# Patient Record
Sex: Male | Born: 1965 | Race: White | Hispanic: No | Marital: Married | Smoking: Former smoker
Health system: Southern US, Community
[De-identification: ages and names within clinical notes are randomized; demographics above are authoritative.]

## PROBLEM LIST (undated history)

## (undated) DIAGNOSIS — I219 Acute myocardial infarction, unspecified: Secondary | ICD-10-CM

## (undated) DIAGNOSIS — Z720 Tobacco use: Secondary | ICD-10-CM

## (undated) DIAGNOSIS — I358 Other nonrheumatic aortic valve disorders: Secondary | ICD-10-CM

## (undated) DIAGNOSIS — R091 Pleurisy: Secondary | ICD-10-CM

## (undated) DIAGNOSIS — Z9889 Other specified postprocedural states: Secondary | ICD-10-CM

## (undated) DIAGNOSIS — D4989 Neoplasm of unspecified behavior of other specified sites: Secondary | ICD-10-CM

## (undated) HISTORY — DX: Neoplasm of unspecified behavior of other specified sites: D49.89

## (undated) HISTORY — DX: Other nonrheumatic aortic valve disorders: I35.8

---

## 2012-10-14 ENCOUNTER — Inpatient Hospital Stay (HOSPITAL_COMMUNITY)
Admission: EM | Admit: 2012-10-14 | Discharge: 2012-10-23 | DRG: 220 | Disposition: A | Discharge status: Home / Self Care | Payer: MEDICAID | Attending: Thoracic Surgery (Cardiothoracic Vascular Surgery) | Admitting: Thoracic Surgery (Cardiothoracic Vascular Surgery)

## 2012-10-14 ENCOUNTER — Emergency Department (HOSPITAL_COMMUNITY): Payer: Self-pay

## 2012-10-14 ENCOUNTER — Encounter (HOSPITAL_COMMUNITY): Payer: Self-pay

## 2012-10-14 DIAGNOSIS — I359 Nonrheumatic aortic valve disorder, unspecified: Secondary | ICD-10-CM | POA: Diagnosis present

## 2012-10-14 DIAGNOSIS — E781 Pure hyperglyceridemia: Secondary | ICD-10-CM | POA: Diagnosis present

## 2012-10-14 DIAGNOSIS — E876 Hypokalemia: Secondary | ICD-10-CM | POA: Diagnosis not present

## 2012-10-14 DIAGNOSIS — Z720 Tobacco use: Secondary | ICD-10-CM | POA: Diagnosis present

## 2012-10-14 DIAGNOSIS — R079 Chest pain, unspecified: Secondary | ICD-10-CM

## 2012-10-14 DIAGNOSIS — J90 Pleural effusion, not elsewhere classified: Secondary | ICD-10-CM | POA: Diagnosis present

## 2012-10-14 DIAGNOSIS — Z9889 Other specified postprocedural states: Secondary | ICD-10-CM

## 2012-10-14 DIAGNOSIS — I214 Non-ST elevation (NSTEMI) myocardial infarction: Principal | ICD-10-CM | POA: Diagnosis present

## 2012-10-14 DIAGNOSIS — F17200 Nicotine dependence, unspecified, uncomplicated: Secondary | ICD-10-CM | POA: Diagnosis present

## 2012-10-14 DIAGNOSIS — Z880 Allergy status to penicillin: Secondary | ICD-10-CM

## 2012-10-14 DIAGNOSIS — I219 Acute myocardial infarction, unspecified: Secondary | ICD-10-CM | POA: Diagnosis present

## 2012-10-14 DIAGNOSIS — D72829 Elevated white blood cell count, unspecified: Secondary | ICD-10-CM | POA: Diagnosis not present

## 2012-10-14 DIAGNOSIS — D62 Acute posthemorrhagic anemia: Secondary | ICD-10-CM | POA: Diagnosis not present

## 2012-10-14 HISTORY — DX: Pleurisy: R09.1

## 2012-10-14 HISTORY — DX: Other specified postprocedural states: Z98.890

## 2012-10-14 HISTORY — DX: Tobacco use: Z72.0

## 2012-10-14 HISTORY — DX: Acute myocardial infarction, unspecified: I21.9

## 2012-10-14 LAB — CBC WITH DIFFERENTIAL/PLATELET
Basophils Relative: 1 % (ref 0–1)
Eosinophils Absolute: 0.4 10*3/uL (ref 0.0–0.7)
Hemoglobin: 16.6 g/dL (ref 13.0–17.0)
MCH: 31.3 pg (ref 26.0–34.0)
MCHC: 35.9 g/dL (ref 30.0–36.0)
Monocytes Relative: 7 % (ref 3–12)
Neutrophils Relative %: 67 % (ref 43–77)
Platelets: 247 10*3/uL (ref 150–400)

## 2012-10-14 LAB — BASIC METABOLIC PANEL
BUN: 8 mg/dL (ref 6–23)
Calcium: 10.1 mg/dL (ref 8.4–10.5)
GFR calc Af Amer: >90 mL/min (ref >90)
GFR calc non Af Amer: >90 mL/min (ref >90)
Potassium: 3.4 mEq/L — ABNORMAL LOW (ref 3.5–5.1)

## 2012-10-14 MED ORDER — NITROGLYCERIN 0.4 MG SL SUBL
0.4000 mg | SUBLINGUAL_TABLET | SUBLINGUAL | Status: DC | PRN
  Administered 2012-10-14 (×2): 0.4 mg via SUBLINGUAL

## 2012-10-14 MED ORDER — HEPARIN (PORCINE) IN NACL 100-0.45 UNIT/ML-% IJ SOLN
15.0000 [IU]/kg/h | INTRAMUSCULAR | Status: DC
  Filled 2012-10-14: qty 250

## 2012-10-14 MED ORDER — NITROGLYCERIN IN D5W 200-5 MCG/ML-% IV SOLN: 5.0000 ug/min | Freq: Once | INTRAVENOUS | Status: DC

## 2012-10-14 MED ORDER — NITROGLYCERIN IN D5W 200-5 MCG/ML-% IV SOLN
5.0000 ug/min | Freq: Once | INTRAVENOUS | Status: AC
  Administered 2012-10-14: 5 ug/min via INTRAVENOUS
  Filled 2012-10-14: qty 250

## 2012-10-14 MED ORDER — NITROGLYCERIN 0.4 MG SL SUBL
0.4000 mg | SUBLINGUAL_TABLET | Freq: Once | SUBLINGUAL | Status: AC
  Administered 2012-10-14: 0.4 mg via SUBLINGUAL
  Filled 2012-10-14: qty 25

## 2012-10-14 MED ORDER — ONDANSETRON HCL 4 MG/2ML IJ SOLN: 4.0000 mg | Freq: Three times a day (TID) | INTRAMUSCULAR | Status: AC | PRN

## 2012-10-14 MED ORDER — HEPARIN (PORCINE) IN NACL 100-0.45 UNIT/ML-% IJ SOLN
1650.0000 [IU]/h | INTRAMUSCULAR | Status: DC
  Administered 2012-10-14: 900 [IU]/h via INTRAVENOUS
  Administered 2012-10-15 – 2012-10-16 (×3): 1600 [IU]/h via INTRAVENOUS
  Administered 2012-10-18 (×2): 1650 [IU]/h via INTRAVENOUS
  Filled 2012-10-14 (×8): qty 250

## 2012-10-14 MED ORDER — SODIUM CHLORIDE 0.9 % IV SOLN: Freq: Once | INTRAVENOUS | Status: AC

## 2012-10-14 MED ORDER — HEPARIN SODIUM (PORCINE) 5000 UNIT/ML IJ SOLN
4500.0000 [IU] | Freq: Once | INTRAMUSCULAR | Status: DC
  Filled 2012-10-14: qty 1

## 2012-10-14 MED ORDER — ASPIRIN 81 MG PO CHEW
324.0000 mg | CHEWABLE_TABLET | Freq: Once | ORAL | Status: AC
  Administered 2012-10-14: 324 mg via ORAL
  Filled 2012-10-14: qty 4

## 2012-10-14 MED ORDER — SODIUM CHLORIDE 0.9 % IV SOLN
INTRAVENOUS | Status: AC
  Administered 2012-10-14: 125 mL/h via INTRAVENOUS

## 2012-10-14 MED ORDER — SODIUM CHLORIDE 0.9 % IV SOLN
Freq: Once | INTRAVENOUS | Status: AC
  Administered 2012-10-14: 500 mL via INTRAVENOUS

## 2012-10-14 MED ORDER — NITROGLYCERIN IN D5W 200-5 MCG/ML-% IV SOLN
5.0000 ug/min | INTRAVENOUS | Status: DC
  Administered 2012-10-14: 15 ug/min via INTRAVENOUS
  Filled 2012-10-14: qty 250

## 2012-10-14 MED ORDER — HEPARIN BOLUS VIA INFUSION
4000.0000 [IU] | Freq: Once | INTRAVENOUS | Status: AC
  Administered 2012-10-14: 4000 [IU] via INTRAVENOUS

## 2012-10-14 MED ORDER — HYDROMORPHONE HCL PF 1 MG/ML IJ SOLN: 0.5000 mg | INTRAMUSCULAR | Status: AC | PRN

## 2012-10-14 NOTE — ED Notes (Signed)
CRITICAL VALUE ALERT  Critical value received:  Troponin I 1.13  Date of notification:  10/14/12  Time of notification:  0747  Critical value read back:yes  Nurse who received alert:  Lawernce Ion  MD notified (1st page):  Bonk  Time of first page:  1747  MD notified (2nd page):  Time of second page:  Responding MD:  Rulon Abide  Time MD responded:  [phone]

## 2012-10-14 NOTE — ED Notes (Addendum)
BP 153/81 1 st nitro given pain 5/10 prior to administration. Pt stated pain had already started to ease up before nitro given.

## 2012-10-14 NOTE — Progress Notes (Signed)
ANTICOAGULATION CONSULT NOTE - Initial Consult  Pharmacy Consult for Heparin Indication: chest pain/ACS  Allergies  Allergen Reactions  . Penicillins     Nausea and headache    Patient Measurements: Height: 5\' 9"  (175.3 cm) Weight: 160 lb (72.576 kg) IBW/kg (Calculated) : 70.7  Heparin Dosing Weight: 72.6 kg  Vital Signs: Temp: 97.7 F (36.5 C) (11/30 1720) Temp src: Oral (11/30 1720) BP: 120/81 mmHg (11/30 1821) Pulse Rate: 84  (11/30 1821)  Labs:  [facility] 10/14/12 1656  HGB 16.6  HCT 46.3  PLT 247  APTT --  LABPROT --  INR --  HEPARINUNFRC --  CREATININE 0.93  CKTOTAL --  CKMB --  TROPONINI 1.13*    Estimated Creatinine Clearance: 98.2 ml/min (by C-G formula based on Cr of 0.93).   Medical History: Past Medical History  Diagnosis Date  . Pleurisy     Medications:  Scheduled:    . [COMPLETED] aspirin  324 mg Oral Once  . [COMPLETED] nitroGLYCERIN  0.4 mg Sublingual Once  . nitroGLYCERIN  5 mcg/min Intravenous Once  . [DISCONTINUED] heparin  4,500 Units Intravenous Once     Assessment: Ok for protocol  Goal of Therapy:  Heparin level 0.3-0.7 units/ml Monitor platelets by anticoagulation protocol: Yes   Plan:  Give 4000 units bolus x 1 Start heparin infusion at 900 units/hr Check anti-Xa level in 6 hours and daily while on heparin Monitor platelets per protocol Labs per protocol  Raquel James, Christien Berthelot Bennett 10/14/2012,6:39 PM

## 2012-10-14 NOTE — Progress Notes (Signed)
[facility] paged and notified of pt status upon admission. Pt VSS, pain rated as 0 on a 0 to 10 scale, will continue to monitor.

## 2012-10-14 NOTE — ED Notes (Signed)
Pt stated pain 1/10, 3rd nitro given , BP 129/81

## 2012-10-14 NOTE — ED Notes (Signed)
Pt reports approx 1 hour ago was out raking leaves and felt like an elephant on his chest and aching and numbness in left arm.  Reports was diaphoretic.  Denies any n/v or SOB

## 2012-10-14 NOTE — ED Notes (Signed)
2nd nitro given, pain 2/10, BP 146/91

## 2012-10-14 NOTE — ED Provider Notes (Signed)
History   This chart was scribed for Jones Skene, MD, by Frederik Pear, ER scribe. The patient was seen in room APA08/APA08 and the patient's care was started at 1701.   CSN: [id number]  Arrival date & time 10/14/12  1649   First MD Initiated Contact with Patient 10/14/12 1701      Chief Complaint  Patient presents with  . Chest Pain    (Consider location/radiation/quality/duration/timing/severity/associated sxs/prior treatment) HPI Comments: Andrew Le is a 46 y.o. male with a h/o of pleurisy who presents to the Emergency Department complaining of constant, moderate chest pain that began one hour PTA while raking leaves.  He states that the 7/10 pain felt heavy in the center of his chest. He states that he was diaphorietic and had nausea when the pain began, but now just has chest pain. He also reports associated left arm pain with numbness and tingling. He denies any associated abdominal or neck pain, leg swelling, coughing, indigestion, acid reflux, fever, chills, or emesis. No dizziness. His wife states that he current does not see a PCP regularly, but has no known h/o of heart conditions. He states that his mother had an aneurysm which started in his kidneys and ascended to the heart in 1986 and his father had a h/o of heart conditions.                Past Medical History  Diagnosis Date  . Pleurisy     History reviewed. No pertinent past surgical history.  No family history on file.  History  Substance Use Topics  . Smoking status: Current Every Day Smoker  . Smokeless tobacco: Not on file  . Alcohol Use: Yes     Comment: occ      Review of Systems At least 10pt or greater review of systems completed and are negative except where specified in the HPI. Allergies  Penicillins  Home Medications  No current outpatient prescriptions on file.  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    ED Course  Procedures (including critical care time)  Date: 10/14/2012  Time 1657  Rate: 82  Rhythm: normal sinus rhythm  QRS Axis: normal  Intervals: normal  ST/T Wave abnormalities: normal  Conduction Disutrbances: none  Narrative Interpretation: unremarkable - patient does have some J-point elevation in leads 2, 3, aVF, V3 - nonischemic EKG   Date: 10/14/2012 Time 1750  Rate: 78   Rhythm: normal sinus rhythm  QRS Axis: normal  Intervals: normal  ST/T Wave abnormalities: normal  Conduction Disutrbances: none  Narrative Interpretation: unremarkable - patient does have some J-point elevation in leads 2, 3, aVF, V3 - nonischemic EKG   DIAGNOSTIC STUDIES: Oxygen Saturation is 99% on room air, normal by my interpretation.    COORDINATION OF CARE:  17:22- Discussed planned course of treatment with the patient, including a chest X-ray, blood work, and an EKG, who is agreeable at this time.   17:30- Medication Orders- aspirin chewable tablet 324 mg- Once, nitroglycerin (NITROSTATE) SL tablet 0.4 mg- Once.  Results for orders placed during the hospital encounter of 10/14/12  CBC WITH DIFFERENTIAL      Component Value Range   WBC 12.1 (*) 4.0 - 10.5 K/uL   RBC 5.31  4.22 - 5.81 MIL/uL   Hemoglobin 16.6  13.0 - 17.0 g/dL  HCT 46.3  39.0 - 52.0 %   MCV 87.2  78.0 - 100.0 fL   MCH 31.3  26.0 - 34.0 pg   MCHC 35.9  30.0 - 36.0 g/dL   RDW 19.1  47.8 - 29.5 %   Platelets 247  150 - 400 K/uL   Neutrophils Relative 67  43 - 77 %   Neutro Abs 8.2 (*) 1.7 - 7.7 K/uL   Lymphocytes Relative 22  12 - 46 %   Lymphs Abs 2.6  0.7 - 4.0 K/uL   Monocytes Relative 7  3 - 12 %   Monocytes Absolute 0.9  0.1 - 1.0 K/uL   Eosinophils Relative 3  0 - 5 %   Eosinophils Absolute 0.4  0.0 - 0.7 K/uL   Basophils Relative 1  0 - 1 %   Basophils Absolute 0.1  0.0 - 0.1 K/uL  BASIC METABOLIC PANEL      Component Value Range   Sodium 136   135 - 145 mEq/L   Potassium 3.4 (*) 3.5 - 5.1 mEq/L   Chloride 99  96 - 112 mEq/L   CO2 26  19 - 32 mEq/L   Glucose, Bld 98  70 - 99 mg/dL   BUN 8  6 - 23 mg/dL   Creatinine, Ser 6.21  0.50 - 1.35 mg/dL   Calcium 30.8  8.4 - 65.7 mg/dL   GFR calc non Af Amer >90  >90 mL/min   GFR calc Af Amer >90  >90 mL/min  TROPONIN I      Component Value Range   Troponin I 1.13 (*) <0.30 ng/mL     Labs Reviewed  CBC WITH DIFFERENTIAL  BASIC METABOLIC PANEL  TROPONIN I   Dg Chest Portable 1 View  10/14/2012  *RADIOLOGY REPORT*  Clinical Data: Chest pain radiating into the left upper arm. Diaphoresis.  PORTABLE CHEST - 1 VIEW 10/14/2012 1706 hours:  Comparison: None.  Findings: Cardiac silhouette normal and mediastinal contours unremarkable for the AP portable technique.  Lungs clear. Pulmonary vascularity normal.  Bronchovascular markings normal.  No pneumothorax.  No pleural effusions.  IMPRESSION: No acute cardiopulmonary disease.  Normal heart size.   Original Report Authenticated By: Hulan Saas, M.D.      1. Chest pain   2. Elevated troponin       MDM  Andrew Le is a 46 y.o. male presenting with a history of her concerning for acute coronary syndrome. Initial EKG shows no changes consistent with ST elevation MI.  He says pain does get better with nitroglycerin sublingual, drops to a 2/10. Labs show an elevated troponin at 1.13.  She is pain quickly returns after nitroglycerin he is put on nitroglycerin drip. Patient is heparinized. Discussed with patient - patient will need to be admitted likely for further testing possible cardiac catheterization. Based on the patient's chest x-ray do not think the patient has an aortic dissection. Chest x-ray is nonacute.   10/14/2012 6:30 PM D/W Cardiology Fellow for Admission to [facility] ICU for CP. Bed request entered.   I personally performed the services described in this documentation, which was scribed in my presence. The recorded  information has been reviewed and is accurate. Jones Skene, M.D.          Jones Skene, MD 10/14/12 2112

## 2012-10-15 DIAGNOSIS — R079 Chest pain, unspecified: Secondary | ICD-10-CM

## 2012-10-15 DIAGNOSIS — I219 Acute myocardial infarction, unspecified: Secondary | ICD-10-CM | POA: Diagnosis present

## 2012-10-15 DIAGNOSIS — I214 Non-ST elevation (NSTEMI) myocardial infarction: Secondary | ICD-10-CM

## 2012-10-15 HISTORY — DX: Acute myocardial infarction, unspecified: I21.9

## 2012-10-15 LAB — LIPID PANEL
Cholesterol: 235 mg/dL — ABNORMAL HIGH (ref 0–200)
HDL: 23 mg/dL — ABNORMAL LOW (ref >39)
LDL Cholesterol: UNDETERMINED mg/dL (ref 0–99)
Total CHOL/HDL Ratio: 10.2 RATIO
Triglycerides: 650 mg/dL — ABNORMAL HIGH (ref <150)
VLDL: UNDETERMINED mg/dL (ref 0–40)

## 2012-10-15 LAB — BASIC METABOLIC PANEL
BUN: 8 mg/dL (ref 6–23)
CO2: 24 mEq/L (ref 19–32)
Calcium: 8.8 mg/dL (ref 8.4–10.5)
Chloride: 107 mEq/L (ref 96–112)
Creatinine, Ser: 0.86 mg/dL (ref 0.50–1.35)
GFR calc Af Amer: >90 mL/min (ref >90)
GFR calc non Af Amer: >90 mL/min (ref >90)
Glucose, Bld: 122 mg/dL — ABNORMAL HIGH (ref 70–99)
Potassium: 3.6 mEq/L (ref 3.5–5.1)
Sodium: 140 mEq/L (ref 135–145)

## 2012-10-15 LAB — HEMOGLOBIN A1C: Hgb A1c MFr Bld: 5.5 % (ref <5.7)

## 2012-10-15 LAB — CBC
MCV: 86.7 fL (ref 78.0–100.0)
Platelets: 226 10*3/uL (ref 150–400)
RDW: 12.8 % (ref 11.5–15.5)
WBC: 13.7 10*3/uL — ABNORMAL HIGH (ref 4.0–10.5)

## 2012-10-15 LAB — TROPONIN I
Troponin I: 6.83 ng/mL (ref <0.30)
Troponin I: 12.8 ng/mL (ref <0.30)
Troponin I: 7.3 ng/mL (ref <0.30)

## 2012-10-15 LAB — TSH: TSH: 6.301 u[IU]/mL — ABNORMAL HIGH (ref 0.350–4.500)

## 2012-10-15 LAB — HEPARIN LEVEL (UNFRACTIONATED)
Heparin Unfractionated: 0.13 IU/mL — ABNORMAL LOW (ref 0.30–0.70)
Heparin Unfractionated: 0.47 IU/mL (ref 0.30–0.70)

## 2012-10-15 MED ORDER — SODIUM CHLORIDE 0.9 % IV SOLN
INTRAVENOUS | Status: DC | PRN
  Administered 2012-10-15 – 2012-10-17 (×2): via INTRAVENOUS

## 2012-10-15 MED ORDER — ONDANSETRON HCL 4 MG/2ML IJ SOLN: 4.0000 mg | Freq: Four times a day (QID) | INTRAMUSCULAR | Status: DC | PRN

## 2012-10-15 MED ORDER — ATORVASTATIN CALCIUM 40 MG PO TABS
40.0000 mg | ORAL_TABLET | Freq: Every day | ORAL | Status: DC
  Administered 2012-10-15 – 2012-10-22 (×8): 40 mg via ORAL
  Filled 2012-10-15 (×10): qty 1

## 2012-10-15 MED ORDER — POTASSIUM CHLORIDE CRYS ER 20 MEQ PO TBCR
20.0000 meq | EXTENDED_RELEASE_TABLET | Freq: Two times a day (BID) | ORAL | Status: DC
  Administered 2012-10-15 – 2012-10-18 (×9): 20 meq via ORAL
  Filled 2012-10-15 (×12): qty 1

## 2012-10-15 MED ORDER — NITROGLYCERIN 0.4 MG SL SUBL: 0.4000 mg | SUBLINGUAL_TABLET | SUBLINGUAL | Status: DC | PRN

## 2012-10-15 MED ORDER — HEPARIN BOLUS VIA INFUSION
2000.0000 [IU] | Freq: Once | INTRAVENOUS | Status: AC
  Administered 2012-10-15: 2000 [IU] via INTRAVENOUS
  Filled 2012-10-15: qty 2000

## 2012-10-15 MED ORDER — ASPIRIN EC 81 MG PO TBEC
81.0000 mg | DELAYED_RELEASE_TABLET | Freq: Every day | ORAL | Status: DC
  Administered 2012-10-15 – 2012-10-18 (×4): 81 mg via ORAL
  Filled 2012-10-15 (×5): qty 1

## 2012-10-15 MED ORDER — ATORVASTATIN CALCIUM 40 MG PO TABS
40.0000 mg | ORAL_TABLET | Freq: Every day | ORAL | Status: DC
  Filled 2012-10-15: qty 1

## 2012-10-15 MED ORDER — METOPROLOL TARTRATE 12.5 MG HALF TABLET
12.5000 mg | ORAL_TABLET | Freq: Two times a day (BID) | ORAL | Status: DC
  Administered 2012-10-15 – 2012-10-16 (×5): 12.5 mg via ORAL
  Filled 2012-10-15 (×7): qty 1

## 2012-10-15 MED ORDER — ACETAMINOPHEN 325 MG PO TABS
650.0000 mg | ORAL_TABLET | ORAL | Status: DC | PRN
  Administered 2012-10-15: 650 mg via ORAL

## 2012-10-15 NOTE — Progress Notes (Signed)
  Echocardiogram 2D Echocardiogram has been performed.  Andrew Le 10/15/2012, 10:30 AM

## 2012-10-15 NOTE — Progress Notes (Addendum)
ANTICOAGULATION CONSULT NOTE  Pharmacy Consult for Heparin Indication: chest pain/ACS  Allergies  Allergen Reactions  . Penicillins     Nausea and headache    Patient Measurements: Height: 5\' 9"  (175.3 cm) Weight: 160 lb (72.576 kg) IBW/kg (Calculated) : 70.7  Heparin Dosing Weight: 72.6 kg  Vital Signs: Temp: 98 F (36.7 C) (12/01 0000) Temp src: Oral (12/01 0000) BP: 135/75 mmHg (12/01 0100) Pulse Rate: 68  (12/01 0100)  Labs:  [facility] 10/15/12 0050 10/14/12 1656  HGB 14.1 16.6  HCT 39.7 46.3  PLT 226 247  APTT -- --  LABPROT -- --  INR -- --  HEPARINUNFRC 0.13* --  CREATININE -- 0.93  CKTOTAL -- --  CKMB -- --  TROPONINI -- 1.13*    Estimated Creatinine Clearance: 98.2 ml/min (by C-G formula based on Cr of 0.93).  Assessment: 46 yo male with chest pain for heparin Goal of Therapy:  Heparin level 0.3-0.7 units/ml Monitor platelets by anticoagulation protocol: Yes   Plan:  Heparin 2000 units IV bolus, then increase heparin 1200 units/hr Check heparin level in 6 hours.  Eddie Candle 10/15/2012,2:01 AM

## 2012-10-15 NOTE — Progress Notes (Deleted)
MD notified of pt's present afib. Will continue to monitor. VSS 

## 2012-10-15 NOTE — H&P (Signed)
Andrew Le is an 46 y.o. male.    Chief Complaint: Chest Pain  HPI: 46 y/o male with no significant PMH presenting for chest pain evaluation.  He was in his usual state of health until the day prior to admission when he developed chest pain while walking up the stairs.  Chest pain is described as pressure, 7/10 in severity, associated with shortness of breath, diaphoresis, nausea but no vomiting.  He presented to [facility] ER where his ECG showed sinus rhythm (84 bpm) and no ST- or T-wave changes to suggest ischemia. His first set of cardiac marker showed a troponin-I of 1.13.  He started on Heparin drip, Nitro drip and Aspirin and transferred to [facility] for further evaluation. Currently, he is chest pain free, and he is clinically stable.  Past Medical History  Diagnosis Date  . Pleurisy     History reviewed. No pertinent past surgical history.  No family history on file. Social History:  reports that he has been smoking.  He does not have any smokeless tobacco history on file. He reports that he drinks alcohol. He reports that he does not use illicit drugs.  Allergies:  Allergies  Allergen Reactions  . Penicillins     Nausea and headache   Medication at home: None  No prescriptions prior to admission    Results for orders placed during the hospital encounter of 10/14/12 (from the past 48 hour(s))  CBC WITH DIFFERENTIAL     Status: Abnormal   Collection Time   10/14/12  4:56 PM      Component Value Range Comment   WBC 12.1 (*) 4.0 - 10.5 K/uL    RBC 5.31  4.22 - 5.81 MIL/uL    Hemoglobin 16.6  13.0 - 17.0 g/dL    HCT 16.1  09.6 - 04.5 %    MCV 87.2  78.0 - 100.0 fL    MCH 31.3  26.0 - 34.0 pg    MCHC 35.9  30.0 - 36.0 g/dL    RDW 40.9  81.1 - 91.4 %    Platelets 247  150 - 400 K/uL    Neutrophils Relative 67  43 - 77 %    Neutro Abs 8.2 (*) 1.7 - 7.7 K/uL    Lymphocytes Relative 22  12 - 46 %    Lymphs Abs 2.6  0.7 - 4.0 K/uL    Monocytes Relative 7  3 - 12 %    Monocytes Absolute 0.9  0.1 - 1.0 K/uL    Eosinophils Relative 3  0 - 5 %    Eosinophils Absolute 0.4  0.0 - 0.7 K/uL    Basophils Relative 1  0 - 1 %    Basophils Absolute 0.1  0.0 - 0.1 K/uL   BASIC METABOLIC PANEL     Status: Abnormal   Collection Time   10/14/12  4:56 PM      Component Value Range Comment   Sodium 136  135 - 145 mEq/L    Potassium 3.4 (*) 3.5 - 5.1 mEq/L    Chloride 99  96 - 112 mEq/L    CO2 26  19 - 32 mEq/L    Glucose, Bld 98  70 - 99 mg/dL    BUN 8  6 - 23 mg/dL    Creatinine, Ser 7.82  0.50 - 1.35 mg/dL    Calcium 95.6  8.4 - 10.5 mg/dL    GFR calc non Af Amer >90  >90 mL/min  GFR calc Af Amer >90  >90 mL/min   TROPONIN I     Status: Abnormal   Collection Time   10/14/12  4:56 PM      Component Value Range Comment   Troponin I 1.13 (*) <0.30 ng/mL   MRSA PCR SCREENING     Status: Normal   Collection Time   10/14/12 10:02 PM      Component Value Range Comment   MRSA by PCR NEGATIVE  NEGATIVE   HEPARIN LEVEL (UNFRACTIONATED)     Status: Abnormal   Collection Time   10/15/12 12:50 AM      Component Value Range Comment   Heparin Unfractionated 0.13 (*) 0.30 - 0.70 IU/mL   CBC     Status: Abnormal   Collection Time   10/15/12 12:50 AM      Component Value Range Comment   WBC 13.7 (*) 4.0 - 10.5 K/uL    RBC 4.58  4.22 - 5.81 MIL/uL    Hemoglobin 14.1  13.0 - 17.0 g/dL    HCT 16.1  09.6 - 04.5 %    MCV 86.7  78.0 - 100.0 fL    MCH 30.8  26.0 - 34.0 pg    MCHC 35.5  30.0 - 36.0 g/dL    RDW 40.9  81.1 - 91.4 %    Platelets 226  150 - 400 K/uL    Dg Chest Portable 1 View  10/14/2012  *RADIOLOGY REPORT*  Clinical Data: Chest pain radiating into the left upper arm. Diaphoresis.  PORTABLE CHEST - 1 VIEW 10/14/2012 1706 hours:  Comparison: None.  Findings: Cardiac silhouette normal and mediastinal contours unremarkable for the AP portable technique.  Lungs clear. Pulmonary vascularity normal.  Bronchovascular markings normal.  No pneumothorax.  No pleural  effusions.  IMPRESSION: No acute cardiopulmonary disease.  Normal heart size.   Original Report Authenticated By: Hulan Saas, M.D.     Review of Systems  Constitutional: Negative for fever, chills, weight loss, malaise/fatigue and diaphoresis.  HENT: Negative for hearing loss, ear pain, nosebleeds, congestion, sore throat, neck pain, tinnitus and ear discharge.   Eyes: Negative for blurred vision, double vision, photophobia, pain, discharge and redness.  Respiratory: Positive for shortness of breath. Negative for cough, hemoptysis, sputum production, wheezing and stridor.   Cardiovascular: Positive for chest pain. Negative for palpitations, orthopnea, claudication, leg swelling and PND.  Gastrointestinal: Positive for nausea. Negative for heartburn, vomiting, abdominal pain, diarrhea, constipation, blood in stool and melena.  Genitourinary: Negative for dysuria, urgency, frequency, hematuria and flank pain.  Musculoskeletal: Negative for myalgias, back pain, joint pain and falls.  Skin: Negative for itching and rash.  Neurological: Negative for dizziness, tingling, tremors, sensory change, weakness and headaches.  Psychiatric/Behavioral: Negative for depression, suicidal ideas and hallucinations.    Blood pressure 138/71, pulse 78, temperature 98 F (36.7 C), temperature source Oral, resp. rate 18, height 5\' 9"  (1.753 m), weight 72.576 kg (160 lb), SpO2 98.00%. Physical Exam  Constitutional: He is oriented to person, place, and time. He appears well-developed and well-nourished. No distress.  HENT:  Head: Normocephalic and atraumatic.  Eyes: EOM are normal. Right eye exhibits no discharge. Left eye exhibits no discharge. No scleral icterus.  Neck: Neck supple. No JVD present. No tracheal deviation present.  Cardiovascular: Normal rate, regular rhythm and normal heart sounds.  Exam reveals no friction rub.   No murmur heard. Respiratory: No stridor. No respiratory distress. He has no  wheezes. He has no rales. He exhibits no  tenderness.  GI: He exhibits no distension. There is no tenderness. There is no rebound.  Musculoskeletal: He exhibits no edema and no tenderness.  Neurological: He is alert and oriented to person, place, and time.  Skin: No rash noted. He is not diaphoretic. No erythema.  Psychiatric: He has a normal mood and affect.     Assessment/Plan  1. NSTEMI 2. Hypokalemia  I will admit the patient to cardiology service and observe him on telemetry.  I will continue the Heparin drip, wean the Nitro drip and start Asprin 81 mg qd, Lipitor 40 mg qhs and low dose beta-blockers.  I will obtain a transthoracic echocardiogram in the morning to evaluate his left ventricular function, and keep him NPO on Sunday night for a cardiac catheterization on Monday or sooner if he becomes unstable or with has chest pain that we are not able to adequately control. I will replace his potassium with K-dur.  Neziah Braley E 10/15/2012, 2:16 AM

## 2012-10-15 NOTE — Progress Notes (Signed)
ANTICOAGULATION CONSULT NOTE  Pharmacy Consult for Heparin Indication: chest pain/ACS  Allergies  Allergen Reactions  . Penicillins     Nausea and headache    Patient Measurements: Height: 5\' 9"  (175.3 cm) Weight: 165 lb 2 oz (74.9 kg) IBW/kg (Calculated) : 70.7   Vital Signs: Temp: 99 F (37.2 C) (12/01 1200) Temp src: Oral (12/01 1552) BP: 111/66 mmHg (12/01 1500) Pulse Rate: 74  (12/01 1500)  Labs:  Basename 10/15/12 1534 10/15/12 0903 10/15/12 0251 10/15/12 0250 10/15/12 0050 10/14/12 1656  HGB -- -- -- -- 14.1 16.6  HCT -- -- -- -- 39.7 46.3  PLT -- -- -- -- 226 247  APTT -- -- -- -- -- --  LABPROT -- -- -- -- -- --  INR -- -- -- -- -- --  HEPARINUNFRC 0.47 0.15* -- -- 0.13* --  CREATININE -- -- 0.86 -- -- 0.93  CKTOTAL -- -- -- -- -- --  CKMB -- -- -- -- -- --  TROPONINI -- 12.80* -- 6.83* -- 1.13*    Estimated Creatinine Clearance: 106.2 ml/min (by C-G formula based on Cr of 0.86).     . heparin 1,600 Units/hr (10/15/12 1120)  . nitroGLYCERIN 15 mcg/min (10/15/12 0047)  . [DISCONTINUED] heparin       Assessment: 46 yo male with chest pain on anticoagulation with Heparin.  Rate was increased due to a subtherapeutic level.  His repeat level was drawn ~2 hours early and is within the therapeutic range.  It is possible that residual bolus effect could be contributing to his current level.  Goal of Therapy:  Heparin level 0.3-0.7 units/ml Monitor platelets by anticoagulation protocol: Yes   Plan:  Continue Heparin at 1600 units/hr Recheck heparin level in 6 hours to confirm   Estella Husk, Pharm.D., BCPS Clinical Pharmacist  Phone [phone] Pager [phone] 10/15/2012, 4:23 PM

## 2012-10-15 NOTE — Progress Notes (Signed)
ANTICOAGULATION CONSULT NOTE  Pharmacy Consult for Heparin Indication: chest pain/ACS  Allergies  Allergen Reactions  . Penicillins     Nausea and headache    Patient Measurements: Height: 5\' 9"  (175.3 cm) Weight: 165 lb 2 oz (74.9 kg) IBW/kg (Calculated) : 70.7  Heparin Dosing Weight: 72.6 kg  Vital Signs: Temp: 97.5 F (36.4 C) (12/01 0800) Temp src: Oral (12/01 0800) BP: 128/63 mmHg (12/01 1000) Pulse Rate: 70  (12/01 1000)  Labs:  Basename 10/15/12 0903 10/15/12 0251 10/15/12 0250 10/15/12 0050 10/14/12 1656  HGB -- -- -- 14.1 16.6  HCT -- -- -- 39.7 46.3  PLT -- -- -- 226 247  APTT -- -- -- -- --  LABPROT -- -- -- -- --  INR -- -- -- -- --  HEPARINUNFRC 0.15* -- -- 0.13* --  CREATININE -- 0.86 -- -- 0.93  CKTOTAL -- -- -- -- --  CKMB -- -- -- -- --  TROPONINI 12.80* -- 6.83* -- 1.13*    Estimated Creatinine Clearance: 106.2 ml/min (by C-G formula based on Cr of 0.86).  Assessment: 46 yo male with chest pain for heparin Goal of Therapy:  Heparin level 0.3-0.7 units/ml Monitor platelets by anticoagulation protocol: Yes   Plan:  Heparin 2000 units IV bolus, then increase heparin 1600units/hr Check heparin level in 6 hours.  Mickeal Skinner 10/15/2012,10:52 AM

## 2012-10-15 NOTE — Progress Notes (Signed)
CRITICAL VALUE ALERT  Critical value received:  Troponin   Date of notification: 10/15/2012   Time of notification:  0405  Critical value read back:yes  Nurse who received alert:  Sherry Ruffing, RN  MD notified (1st page):  Skeet Latch, MD  Time of first page:  0406  MD notified (2nd page):  Time of second page:  Responding MD:    Time MD responded:  [phone]

## 2012-10-15 NOTE — Progress Notes (Signed)
ANTICOAGULATION CONSULT NOTE  Pharmacy Consult for Heparin Indication: chest pain/ACS  Allergies  Allergen Reactions  . Penicillins     Nausea and headache    Patient Measurements: Height: 5\' 9"  (175.3 cm) Weight: 165 lb 2 oz (74.9 kg) IBW/kg (Calculated) : 70.7   Vital Signs: Temp: 98.7 F (37.1 C) (12/01 2000) Temp src: Oral (12/01 2000) BP: 107/60 mmHg (12/01 2300) Pulse Rate: 65  (12/01 2300)  Labs:  Basename 10/15/12 2210 10/15/12 1534 10/15/12 0903 10/15/12 0251 10/15/12 0250 10/15/12 0050 10/14/12 1656  HGB -- -- -- -- -- 14.1 16.6  HCT -- -- -- -- -- 39.7 46.3  PLT -- -- -- -- -- 226 247  APTT -- -- -- -- -- -- --  LABPROT -- -- -- -- -- -- --  INR -- -- -- -- -- -- --  HEPARINUNFRC 0.43 0.47 0.15* -- -- -- --  CREATININE -- -- -- 0.86 -- -- 0.93  CKTOTAL -- -- -- -- -- -- --  CKMB -- -- -- -- -- -- --  TROPONINI -- 7.30* 12.80* -- 6.83* -- --    Estimated Creatinine Clearance: 106.2 ml/min (by C-G formula based on Cr of 0.86).     . heparin 1,600 Units/hr (10/15/12 1120)  . nitroGLYCERIN 15 mcg/min (10/15/12 0047)     Assessment: 46 yo male with chest pain on anticoagulation with Heparin. Heparin level (0.43) is at-goal on 1600 units/hr.   Goal of Therapy:  Heparin level 0.3-0.7 units/ml Monitor platelets by anticoagulation protocol: Yes   Plan:  1. Continue IV heparin at 1600 units/hr 2. Daily CBC, heparin level  Lorre Munroe, PharmD 10/15/2012, 11:42 PM

## 2012-10-15 NOTE — Progress Notes (Signed)
SUBJECTIVE:  Patient admitted early this am for chest pain.   Currently no chest pain.    PHYSICAL EXAM Filed Vitals:   10/15/12 0500 10/15/12 0600 10/15/12 0700 10/15/12 0800  BP: 130/79 124/77 127/73 124/77  Pulse: 64 63 64 79  Temp:    97.5 F (36.4 C)  TempSrc:    Oral  Resp: 19 11 18 20   Height: 5\' 9"  (1.753 m)     Weight: 165 lb 2 oz (74.9 kg)     SpO2: 98% 97% 98% 98%   General:  No distress Lungs:  Clear Heart:  RRR Abdomen:  Positive bowel sounds, no rebound no guarding Extremities:  No edema  LABS: Lab Results  Component Value Date   TROPONINI 6.83* 10/15/2012   Results for orders placed during the hospital encounter of 10/14/12 (from the past 24 hour(s))  CBC WITH DIFFERENTIAL     Status: Abnormal   Collection Time   10/14/12  4:56 PM      Component Value Range   WBC 12.1 (*) 4.0 - 10.5 K/uL   RBC 5.31  4.22 - 5.81 MIL/uL   Hemoglobin 16.6  13.0 - 17.0 g/dL   HCT 96.0  45.4 - 09.8 %   MCV 87.2  78.0 - 100.0 fL   MCH 31.3  26.0 - 34.0 pg   MCHC 35.9  30.0 - 36.0 g/dL   RDW 11.9  14.7 - 82.9 %   Platelets 247  150 - 400 K/uL   Neutrophils Relative 67  43 - 77 %   Neutro Abs 8.2 (*) 1.7 - 7.7 K/uL   Lymphocytes Relative 22  12 - 46 %   Lymphs Abs 2.6  0.7 - 4.0 K/uL   Monocytes Relative 7  3 - 12 %   Monocytes Absolute 0.9  0.1 - 1.0 K/uL   Eosinophils Relative 3  0 - 5 %   Eosinophils Absolute 0.4  0.0 - 0.7 K/uL   Basophils Relative 1  0 - 1 %   Basophils Absolute 0.1  0.0 - 0.1 K/uL  BASIC METABOLIC PANEL     Status: Abnormal   Collection Time   10/14/12  4:56 PM      Component Value Range   Sodium 136  135 - 145 mEq/L   Potassium 3.4 (*) 3.5 - 5.1 mEq/L   Chloride 99  96 - 112 mEq/L   CO2 26  19 - 32 mEq/L   Glucose, Bld 98  70 - 99 mg/dL   BUN 8  6 - 23 mg/dL   Creatinine, Ser 5.62  0.50 - 1.35 mg/dL   Calcium 13.0  8.4 - 86.5 mg/dL   GFR calc non Af Amer >90  >90 mL/min   GFR calc Af Amer >90  >90 mL/min  TROPONIN I     Status:  Abnormal   Collection Time   10/14/12  4:56 PM      Component Value Range   Troponin I 1.13 (*) <0.30 ng/mL  MRSA PCR SCREENING     Status: Normal   Collection Time   10/14/12 10:02 PM      Component Value Range   MRSA by PCR NEGATIVE  NEGATIVE  HEPARIN LEVEL (UNFRACTIONATED)     Status: Abnormal   Collection Time   10/15/12 12:50 AM      Component Value Range   Heparin Unfractionated 0.13 (*) 0.30 - 0.70 IU/mL  CBC     Status: Abnormal   Collection Time  10/15/12 12:50 AM      Component Value Range   WBC 13.7 (*) 4.0 - 10.5 K/uL   RBC 4.58  4.22 - 5.81 MIL/uL   Hemoglobin 14.1  13.0 - 17.0 g/dL   HCT 16.1  09.6 - 04.5 %   MCV 86.7  78.0 - 100.0 fL   MCH 30.8  26.0 - 34.0 pg   MCHC 35.5  30.0 - 36.0 g/dL   RDW 40.9  81.1 - 91.4 %   Platelets 226  150 - 400 K/uL  TROPONIN I     Status: Abnormal   Collection Time   10/15/12  2:50 AM      Component Value Range   Troponin I 6.83 (*) <0.30 ng/mL  PRO B NATRIURETIC PEPTIDE     Status: Normal   Collection Time   10/15/12  2:50 AM      Component Value Range   Pro B Natriuretic peptide (BNP) 55.8  0 - 125 pg/mL  BASIC METABOLIC PANEL     Status: Abnormal   Collection Time   10/15/12  2:51 AM      Component Value Range   Sodium 140  135 - 145 mEq/L   Potassium 3.6  3.5 - 5.1 mEq/L   Chloride 107  96 - 112 mEq/L   CO2 24  19 - 32 mEq/L   Glucose, Bld 122 (*) 70 - 99 mg/dL   BUN 8  6 - 23 mg/dL   Creatinine, Ser 7.82  0.50 - 1.35 mg/dL   Calcium 8.8  8.4 - 95.6 mg/dL   GFR calc non Af Amer >90  >90 mL/min   GFR calc Af Amer >90  >90 mL/min  LIPID PANEL     Status: Abnormal   Collection Time   10/15/12  4:45 AM      Component Value Range   Cholesterol 235 (*) 0 - 200 mg/dL   Triglycerides 213 (*) <150 mg/dL   HDL 23 (*) >08 mg/dL   Total CHOL/HDL Ratio 10.2     VLDL UNABLE TO CALCULATE IF TRIGLYCERIDE OVER 400 mg/dL  0 - 40 mg/dL   LDL Cholesterol UNABLE TO CALCULATE IF TRIGLYCERIDE OVER 400 mg/dL  0 - 99 mg/dL     Intake/Output Summary (Last 24 hours) at 10/15/12 0848 Last data filed at 10/15/12 0700  Gross per 24 hour  Intake 597.82 ml  Output      0 ml  Net 597.82 ml    EKG:  NSR rate 64, no acute ST T wave changes.   10/15/2012   ASSESSMENT AND PLAN:  NQWMI Cath in am.  The patient understands that risks included but are not limited to stroke (1 in 1000), death (1 in 1000), kidney failure [usually temporary] (1 in 500), bleeding (1 in 200), allergic reaction [possibly serious] (1 in 200).  The patient understands and agrees to proceed.   HYPERTRIGLYCERIDEMIA He was started on a statin and I will order a dietary consult.  He might need combination therapy.  TSH and A1C pending     Andrew Le 10/15/2012 8:48 AM

## 2012-10-16 ENCOUNTER — Encounter (HOSPITAL_COMMUNITY)
Admission: EM | Disposition: A | Discharge status: Home / Self Care | Payer: Self-pay | Source: Home / Self Care | Attending: Cardiology

## 2012-10-16 DIAGNOSIS — R7989 Other specified abnormal findings of blood chemistry: Secondary | ICD-10-CM

## 2012-10-16 DIAGNOSIS — I339 Acute and subacute endocarditis, unspecified: Secondary | ICD-10-CM

## 2012-10-16 LAB — HEPARIN LEVEL (UNFRACTIONATED): Heparin Unfractionated: 0.36 IU/mL (ref 0.30–0.70)

## 2012-10-16 LAB — CBC
MCHC: 34.9 g/dL (ref 30.0–36.0)
MCV: 88.3 fL (ref 78.0–100.0)
Platelets: 222 10*3/uL (ref 150–400)
RDW: 12.7 % (ref 11.5–15.5)
WBC: 11.5 10*3/uL — ABNORMAL HIGH (ref 4.0–10.5)

## 2012-10-16 SURGERY — ECHOCARDIOGRAM, TRANSESOPHAGEAL
Anesthesia: Moderate Sedation

## 2012-10-16 MED ORDER — MIDAZOLAM HCL 2 MG/2ML IJ SOLN
INTRAMUSCULAR | Status: AC
  Administered 2012-10-16: 2 mg
  Filled 2012-10-16: qty 2

## 2012-10-16 MED ORDER — MIDAZOLAM HCL 2 MG/2ML IJ SOLN
INTRAMUSCULAR | Status: AC
  Administered 2012-10-16: 2 mg
  Filled 2012-10-16: qty 4

## 2012-10-16 MED ORDER — MIDAZOLAM HCL 2 MG/2ML IJ SOLN
INTRAMUSCULAR | Status: AC
  Administered 2012-10-16: 6 mg
  Filled 2012-10-16: qty 2

## 2012-10-16 MED ORDER — FENTANYL CITRATE 0.05 MG/ML IJ SOLN
INTRAMUSCULAR | Status: AC
  Administered 2012-10-16: 100 ug
  Filled 2012-10-16: qty 2

## 2012-10-16 MED ORDER — ZOLPIDEM TARTRATE 5 MG PO TABS
5.0000 mg | ORAL_TABLET | Freq: Every evening | ORAL | Status: DC | PRN
  Administered 2012-10-16 – 2012-10-18 (×3): 5 mg via ORAL
  Filled 2012-10-16 (×3): qty 1

## 2012-10-16 NOTE — Interval H&P Note (Signed)
History and Physical Interval Note:  10/16/2012 2:25 PM  Andrew Le  has presented today for surgery, with the diagnosis of endocarditis  The various methods of treatment have been discussed with the patient and family. After consideration of risks, benefits and other options for treatment, the patient has consented to  Procedure(s) (LRB) with comments: TRANSESOPHAGEAL ECHOCARDIOGRAM (TEE) (N/A) as a surgical intervention .  The patient's history has been reviewed, patient examined, no change in status, stable for surgery.  I have reviewed the patient's chart and labs.  Questions were answered to the patient's satisfaction.     Theron Arista [facility] 10/16/2012 2:25 PM

## 2012-10-16 NOTE — Progress Notes (Signed)
ANTICOAGULATION CONSULT NOTE - Follow Up Consult  Pharmacy Consult for heparin Indication: chest pain/ACS  Allergies  Allergen Reactions  . Penicillins     Nausea and headache    Patient Measurements: Height: 5\' 9"  (175.3 cm) Weight: 160 lb 7.9 oz (72.8 kg) IBW/kg (Calculated) : 70.7    Vital Signs: Temp: 98.3 F (36.8 C) (12/02 0700) Temp src: Oral (12/02 0700) BP: 117/69 mmHg (12/02 0700) Pulse Rate: 68  (12/02 0700)  Labs:  Basename 10/16/12 0500 10/15/12 2210 10/15/12 1534 10/15/12 0903 10/15/12 0251 10/15/12 0250 10/15/12 0050 10/14/12 1656  HGB 14.5 -- -- -- -- -- 14.1 --  HCT 41.5 -- -- -- -- -- 39.7 46.3  PLT 222 -- -- -- -- -- 226 247  APTT -- -- -- -- -- -- -- --  LABPROT -- -- -- -- -- -- -- --  INR -- -- -- -- -- -- -- --  HEPARINUNFRC 0.36 0.43 0.47 -- -- -- -- --  CREATININE -- -- -- -- 0.86 -- -- 0.93  CKTOTAL -- -- -- -- -- -- -- --  CKMB -- -- -- -- -- -- -- --  TROPONINI -- -- 7.30* 12.80* -- 6.83* -- --    Estimated Creatinine Clearance: 106.2 ml/min (by C-G formula based on Cr of 0.86).  Assessment: Patient is a 46 y.o M on heparin for suspected ACS.  Holding off on cath procedure for now d/t aortic mass and plan for TEE later today.  Cont heparin therapy per cards.  Heparin level is at goal this morning.  Goal of Therapy:  Heparin level 0.3-0.7 units/ml Monitor platelets by anticoagulation protocol: Yes   Plan:  Plan: 1) no change for heparin   Everline Mahaffy P 10/16/2012,9:07 AM

## 2012-10-16 NOTE — CV Procedure (Signed)
TEE preliminary report.  Performed at bedside.  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 The aortic valve is mildly thickened. There is no aortic insufficiency. No annular abscess. The other valves are normal. LV function is normal. Full report to follow.  Peter Swaz[place] MD, [facility]

## 2012-10-16 NOTE — Progress Notes (Signed)
  Echocardiogram 2D Echocardiogram has been performed.  Andrew Le 10/16/2012, 3:32 PM

## 2012-10-16 NOTE — Plan of Care (Signed)
Problem: Food- and Nutrition-Related Knowledge Deficit (NB-1.1) Goal: Nutrition education Formal process to instruct or train a patient/client in a skill or to impart knowledge to help patients/clients voluntarily manage or modify food choices and eating behavior to maintain or improve health.  Outcome: Completed/Met Date Met:  10/16/12 RD consulted for diet education. Pt with elevated Triglycerides and Cholesterol.  Lipid Panel     Component Value Date/Time    CHOL 235* 10/15/2012 0445    TRIG 650* 10/15/2012 0445    HDL 23* 10/15/2012 0445    CHOLHDL 10.2 10/15/2012 0445    VLDL UNABLE TO CALCULATE IF TRIGLYCERIDE OVER 400 mg/dL 21/01/864 7846    LDLCALC UNABLE TO CALCULATE IF TRIGLYCERIDE OVER 400 mg/dL 96/12/9526 4132     RD provided pt with nutrition hand outs from the [employer] for lowering Triglycerides and Cholesterol.  RD went over hand outs including foods to avoid that can increase both values such as saturated and trans fat, and high intake of carbohydrates. RD also went over foods to increase, lean protein, fiber and fruits/vegetables.   Pt verbalized understanding of information. No additional questions at this time. Chart reviewed, no additional nutrition interventions at this time. Please re-consult as needed.   Clarene Duke RD, LDN Pager [phone] After Hours pager [phone]

## 2012-10-16 NOTE — Progress Notes (Signed)
  SUBJECTIVE:  No further chest pain.  He feels well.  No SOB   PHYSICAL EXAM Filed Vitals:   10/16/12 0300 10/16/12 0400 10/16/12 0500 10/16/12 0600  BP: 111/74 118/76 114/67 115/72  Pulse: 67 65 70 70  Temp:  98.3 F (36.8 C)    TempSrc:  Oral    Resp: 17 15 14 15  Height:      Weight:   160 lb 7.9 oz (72.8 kg)   SpO2: 96% 97% 97% 96%   General:  No distress Lungs:  Clear Heart:  RRR Abdomen:  Positive bowel sounds, no rebound no guarding Extremities:  No edema  LABS: Lab Results  Component Value Date   TROPONINI 7.30* 10/15/2012   Results for orders placed during the hospital encounter of 10/14/12 (from the past 24 hour(s))  HEPARIN LEVEL (UNFRACTIONATED)     Status: Abnormal   Collection Time   10/15/12  9:03 AM      Component Value Range   Heparin Unfractionated 0.15 (*) 0.30 - 0.70 IU/mL  TROPONIN I     Status: Abnormal   Collection Time   10/15/12  9:03 AM      Component Value Range   Troponin I 12.80 (*) <0.30 ng/mL  TROPONIN I     Status: Abnormal   Collection Time   10/15/12  3:34 PM      Component Value Range   Troponin I 7.30 (*) <0.30 ng/mL  HEPARIN LEVEL (UNFRACTIONATED)     Status: Normal   Collection Time   10/15/12  3:34 PM      Component Value Range   Heparin Unfractionated 0.47  0.30 - 0.70 IU/mL  HEPARIN LEVEL (UNFRACTIONATED)     Status: Normal   Collection Time   10/15/12 10:10 PM      Component Value Range   Heparin Unfractionated 0.43  0.30 - 0.70 IU/mL  HEPARIN LEVEL (UNFRACTIONATED)     Status: Normal   Collection Time   10/16/12  5:00 AM      Component Value Range   Heparin Unfractionated 0.36  0.30 - 0.70 IU/mL  CBC     Status: Abnormal   Collection Time   10/16/12  5:00 AM      Component Value Range   WBC 11.5 (*) 4.0 - 10.5 K/uL   RBC 4.70  4.22 - 5.81 MIL/uL   Hemoglobin 14.5  13.0 - 17.0 g/dL   HCT 41.5  39.0 - 52.0 %   MCV 88.3  78.0 - 100.0 fL   MCH 30.9  26.0 - 34.0 pg   MCHC 34.9  30.0 - 36.0 g/dL   RDW 12.7  11.5 -  15.5 %   Platelets 222  150 - 400 K/uL    Intake/Output Summary (Last 24 hours) at 10/16/12 0742 Last data filed at 10/16/12 0600  Gross per 24 hour  Intake 1835.17 ml  Output   3535 ml  Net -1699.83 ml    ASSESSMENT AND PLAN:  AORTIC MASS:   There is an aortic mass identified above the valve in or slightly above the left coronary cusp.  I am arranging a TEE and I have discussed this at length with the patient and his wife.  Embolization from this mass could explain the elevated enzymes. These are trending down.   NQWMI Given the aortic mass he will not get a cath.  If we need to we could likely look at his coronaries with CT.  There was no wall   motion abnormality. I will stop the IV NTG.  I will continue the heparin for now.  Also on beta blocker.   HYPERTRIGLYCERIDEMIA Continue statin and dietary consult ordered.      Aaliyan Brinkmeier 10/16/2012 7:42 AM   

## 2012-10-16 NOTE — H&P (View-Only) (Signed)
SUBJECTIVE:  No further chest pain.  He feels well.  No SOB   PHYSICAL EXAM Filed Vitals:   10/16/12 0300 10/16/12 0400 10/16/12 0500 10/16/12 0600  BP: 111/74 118/76 114/67 115/72  Pulse: 67 65 70 70  Temp:  98.3 F (36.8 C)    TempSrc:  Oral    Resp: 17 15 14 15   Height:      Weight:   160 lb 7.9 oz (72.8 kg)   SpO2: 96% 97% 97% 96%   General:  No distress Lungs:  Clear Heart:  RRR Abdomen:  Positive bowel sounds, no rebound no guarding Extremities:  No edema  LABS: Lab Results  Component Value Date   TROPONINI 7.30* 10/15/2012   Results for orders placed during the hospital encounter of 10/14/12 (from the past 24 hour(s))  HEPARIN LEVEL (UNFRACTIONATED)     Status: Abnormal   Collection Time   10/15/12  9:03 AM      Component Value Range   Heparin Unfractionated 0.15 (*) 0.30 - 0.70 IU/mL  TROPONIN I     Status: Abnormal   Collection Time   10/15/12  9:03 AM      Component Value Range   Troponin I 12.80 (*) <0.30 ng/mL  TROPONIN I     Status: Abnormal   Collection Time   10/15/12  3:34 PM      Component Value Range   Troponin I 7.30 (*) <0.30 ng/mL  HEPARIN LEVEL (UNFRACTIONATED)     Status: Normal   Collection Time   10/15/12  3:34 PM      Component Value Range   Heparin Unfractionated 0.47  0.30 - 0.70 IU/mL  HEPARIN LEVEL (UNFRACTIONATED)     Status: Normal   Collection Time   10/15/12 10:10 PM      Component Value Range   Heparin Unfractionated 0.43  0.30 - 0.70 IU/mL  HEPARIN LEVEL (UNFRACTIONATED)     Status: Normal   Collection Time   10/16/12  5:00 AM      Component Value Range   Heparin Unfractionated 0.36  0.30 - 0.70 IU/mL  CBC     Status: Abnormal   Collection Time   10/16/12  5:00 AM      Component Value Range   WBC 11.5 (*) 4.0 - 10.5 K/uL   RBC 4.70  4.22 - 5.81 MIL/uL   Hemoglobin 14.5  13.0 - 17.0 g/dL   HCT 45.4  09.8 - 11.9 %   MCV 88.3  78.0 - 100.0 fL   MCH 30.9  26.0 - 34.0 pg   MCHC 34.9  30.0 - 36.0 g/dL   RDW 14.7  82.9 -  56.2 %   Platelets 222  150 - 400 K/uL    Intake/Output Summary (Last 24 hours) at 10/16/12 1308 Last data filed at 10/16/12 0600  Gross per 24 hour  Intake 1835.17 ml  Output   3535 ml  Net -1699.83 ml    ASSESSMENT AND PLAN:  AORTIC MASS:   There is an aortic mass identified above the valve in or slightly above the left coronary cusp.  I am arranging a TEE and I have discussed this at length with the patient and his wife.  Embolization from this mass could explain the elevated enzymes. These are trending down.   NQWMI Given the aortic mass he will not get a cath.  If we need to we could likely look at his coronaries with CT.  There was no wall  motion abnormality. I will stop the IV NTG.  I will continue the heparin for now.  Also on beta blocker.   HYPERTRIGLYCERIDEMIA Continue statin and dietary consult ordered.      Fayrene Fearing [facility] 10/16/2012 7:42 AM

## 2012-10-17 ENCOUNTER — Encounter (HOSPITAL_COMMUNITY): Payer: Self-pay | Admitting: Thoracic Surgery (Cardiothoracic Vascular Surgery)

## 2012-10-17 ENCOUNTER — Inpatient Hospital Stay (HOSPITAL_COMMUNITY): Payer: 59

## 2012-10-17 DIAGNOSIS — D487 Neoplasm of uncertain behavior of other specified sites: Secondary | ICD-10-CM

## 2012-10-17 DIAGNOSIS — I219 Acute myocardial infarction, unspecified: Secondary | ICD-10-CM

## 2012-10-17 DIAGNOSIS — Z720 Tobacco use: Secondary | ICD-10-CM

## 2012-10-17 HISTORY — DX: Tobacco use: Z72.0

## 2012-10-17 LAB — CBC
HCT: 44.5 % (ref 39.0–52.0)
MCH: 30.5 pg (ref 26.0–34.0)
MCV: 86.6 fL (ref 78.0–100.0)
RDW: 12.6 % (ref 11.5–15.5)
WBC: 12.7 10*3/uL — ABNORMAL HIGH (ref 4.0–10.5)

## 2012-10-17 MED ORDER — NITROGLYCERIN 0.4 MG SL SUBL
SUBLINGUAL_TABLET | SUBLINGUAL | Status: AC
  Filled 2012-10-17: qty 25

## 2012-10-17 MED ORDER — ATENOLOL 50 MG PO TABS
50.0000 mg | ORAL_TABLET | Freq: Once | ORAL | Status: AC
  Administered 2012-10-17: 50 mg via ORAL
  Filled 2012-10-17: qty 1

## 2012-10-17 MED ORDER — ALPRAZOLAM 0.25 MG PO TABS: 0.2500 mg | ORAL_TABLET | Freq: Three times a day (TID) | ORAL | Status: DC | PRN

## 2012-10-17 MED ORDER — NICOTINE 14 MG/24HR TD PT24
14.0000 mg | MEDICATED_PATCH | Freq: Every day | TRANSDERMAL | Status: DC
  Administered 2012-10-17 – 2012-10-19 (×3): 14 mg via TRANSDERMAL
  Filled 2012-10-17 (×3): qty 1

## 2012-10-17 MED ORDER — METOPROLOL TARTRATE 1 MG/ML IV SOLN
INTRAVENOUS | Status: AC
  Administered 2012-10-17: 5 mg
  Filled 2012-10-17: qty 5

## 2012-10-17 MED ORDER — IOHEXOL 350 MG/ML SOLN
80.0000 mL | Freq: Once | INTRAVENOUS | Status: AC | PRN
  Administered 2012-10-17: 80 mL via INTRAVENOUS

## 2012-10-17 NOTE — Progress Notes (Signed)
    SUBJECTIVE:  No chest pain.  No SOB   PHYSICAL EXAM Filed Vitals:   10/17/12 0500 10/17/12 0600 10/17/12 0700 10/17/12 0800  BP: 101/58 112/69 114/72 110/71  Pulse: 64 59 66 76  Temp:    97.5 F (36.4 C)  TempSrc:    Oral  Resp: 15 16 17 17   Height:      Weight: 158 lb 8.2 oz (71.9 kg)     SpO2: 96% 95% 95% 98%   General:  No distress Lungs:  Clear Heart:  RRR Abdomen:  Positive bowel sounds, no rebound no guarding Extremities:  No edema  LABS: Lab Results  Component Value Date   TROPONINI 7.30* 10/15/2012   Results for orders placed during the hospital encounter of 10/14/12 (from the past 24 hour(s))  HEPARIN LEVEL (UNFRACTIONATED)     Status: Normal   Collection Time   10/17/12  4:35 AM      Component Value Range   Heparin Unfractionated 0.30  0.30 - 0.70 IU/mL  CBC     Status: Abnormal   Collection Time   10/17/12  4:35 AM      Component Value Range   WBC 12.7 (*) 4.0 - 10.5 K/uL   RBC 5.14  4.22 - 5.81 MIL/uL   Hemoglobin 15.7  13.0 - 17.0 g/dL   HCT 29.5  62.1 - 30.8 %   MCV 86.6  78.0 - 100.0 fL   MCH 30.5  26.0 - 34.0 pg   MCHC 35.3  30.0 - 36.0 g/dL   RDW 65.7  84.6 - 96.2 %   Platelets 250  150 - 400 K/uL    Intake/Output Summary (Last 24 hours) at 10/17/12 9528 Last data filed at 10/17/12 0800  Gross per 24 hour  Intake 1585.1 ml  Output   2650 ml  Net -1064.9 ml    ASSESSMENT AND PLAN:  AORTIC MASS:   On TEE there is a 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  Also, arguing against this is the possibility of two lesions.  He does not have any clinical evidence otherwise for infected endocarditis.  I will check a antiphospholipid antibody but I don't strongly suspect other etiologies for NBTE.  Blood cultures have been drawn.  NQWMI I have ordered  a coronary CT angiogram. I suspect an embolic event.  HYPERTRIGLYCERIDEMIA Continue statin and dietary consult completed.   TOBACCO ABUSE I will start a nicotine patch and give PRN Xanax.     Fayrene Fearing [facility] 10/17/2012 9:17 AM

## 2012-10-17 NOTE — Consult Note (Signed)
CARDIOTHORACIC SURGERY CONSULTATION REPORT  PCP is No primary provider on file. Referring Provider is Rollene Rotunda, MD   Reason for consultation:  Aortic valve mass  HPI:  Patient is a 46 year old male with unremarkable past medical history other than long-standing history of tobacco use who was in his usual state of health until November 30 when he developed sudden onset of crushing substernal chest pain associated with shortness of breath and diaphoresis. Pain persisted prompting him to present to the emergency department at [facility] where EKG revealed sinus rhythm without acute ST changes. Cardiac enzymes were abnormal prompting transfer to [facility] for further therapy. The patient ruled in for an acute non-ST segment elevation myocardial infarction. Transthoracic echocardiogram was performed and notable for the presence of a mobile mass adherent to the aortic valve. Transesophageal echocardiogram was performed confirming the presence of a pedunculated mass adherent to the aortic valve at the commissure between the left and non-coronary sinus of Valsalva. The mass is quite mobile and pedunculated. Blood cultures were obtained and remain negative the patient has no other clinical stigmata to suggest ongoing bacterial endocarditis. Cardiac gated CT scan was performed earlier today. This confirms the presence of the mass adherent to the aortic valve is also notable for the absence of any significant proximal vessel coronary artery disease. Her thoracic surgical consultation has been requested.  The patient reports no preceding episodes of substernal chest pain or chest pressure prior to that which developed 3 days ago when prompted hospital admission. The patient has had no further episodes of chest pain since he's been in the hospital. Prior to this event he remained quite active physically and he specifically denies any exertional chest pain, shortness of breath,  palpitations, dizzy spells, or transient neurologic deficits.  Past Medical History  Diagnosis Date  . Pleurisy   . Tobacco abuse 10/17/2012  . Acute myocardial infarction 10/15/2012    History reviewed. No pertinent past surgical history.  No family history on file.  History   Social History  . Marital Status: Married    Spouse Name: N/A    Number of Children: N/A  . Years of Education: N/A   Occupational History  . Not on file.   Social History Main Topics  . Smoking status: Current Every Day Smoker  . Smokeless tobacco: Not on file  . Alcohol Use: Yes     Comment: occ  . Drug Use: No  . Sexually Active:    Other Topics Concern  . Not on file   Social History Narrative  . No narrative on file    Prior to Admission medications   Not on File    Current Facility-Administered Medications  Medication Dose Route Frequency Provider Last Rate Last Dose  . 0.9 %  sodium chloride infusion   Intravenous PRN Jonelle Sidle, MD 10 mL/hr at 10/17/12 1612    . acetaminophen (TYLENOL) tablet 650 mg  650 mg Oral Q4H PRN Jonelle Sidle, MD   650 mg at 10/15/12 2146  . ALPRAZolam Prudy Feeler) tablet 0.25 mg  0.25 mg Oral TID PRN Rollene Rotunda, MD      . aspirin EC tablet 81 mg  81 mg Oral Daily Jonelle Sidle, MD   81 mg at 10/17/12 0948  . [COMPLETED] atenolol (TENORMIN) tablet 50 mg  50 mg Oral Once Rollene Rotunda, MD   50 mg at 10/17/12 0948  . atorvastatin (LIPITOR) tablet 40 mg  40 mg Oral q1800 Jonelle Sidle, MD   40 mg at 10/17/12 1726  . heparin ADULT infusion 100 units/mL (25000 units/250 mL)  1,650 Units/hr Intravenous Continuous Anh P Pham, PHARMD 16.5 mL/hr at 10/17/12 0950 1,650 Units/hr at 10/17/12 0950  . [COMPLETED] iohexol (OMNIPAQUE) 350 MG/ML injection 80 mL  80 mL Intravenous Once PRN Medication Radiologist, MD   80 mL at 10/17/12 1232  . [COMPLETED] metoprolol (LOPRESSOR) 1 MG/ML injection        5 mg at 10/17/12 1130  . nicotine (NICODERM CQ - dosed  in mg/24 hours) patch 14 mg  14 mg Transdermal Daily Rollene Rotunda, MD   14 mg at 10/17/12 0948  . nitroGLYCERIN (NITROSTAT) SL tablet 0.4 mg  0.4 mg Sublingual Q5 Min x 3 PRN Jonelle Sidle, MD      . ondansetron [facility]) injection 4 mg  4 mg Intravenous Q6H PRN Jonelle Sidle, MD      . potassium chloride SA (K-DUR,KLOR-CON) CR tablet 20 mEq  20 mEq Oral BID Jonelle Sidle, MD   20 mEq at 10/17/12 0948  . zolpidem (AMBIEN) tablet 5 mg  5 mg Oral QHS PRN Ok Anis, NP   5 mg at 10/16/12 2305  . [DISCONTINUED] metoprolol tartrate (LOPRESSOR) tablet 12.5 mg  12.5 mg Oral BID Jonelle Sidle, MD   12.5 mg at 10/16/12 2110  . [DISCONTINUED] nitroGLYCERIN (NITROSTAT) 0.4 MG SL tablet           . [DISCONTINUED] nitroGLYCERIN (NITROSTAT) SL tablet 0.4 mg  0.4 mg Sublingual Q5 min PRN John-Adam Bonk, MD   0.4 mg at 10/14/12 1816    Allergies  Allergen Reactions  . Penicillins     Nausea and headache      Review of Systems:   General:  normal appetite, normal energy, no weight gain, no weight loss, no fever  Cardiac:  no chest pain with exertion, no chest pain at rest prior to that which prompted admission, mild SOB with very strenuous exertion, no resting SOB, no PND, no orthopnea, no palpitations, no arrhythmia, no atrial fibrillation, no LE edema, no dizzy spells, no syncope  Respiratory:  no shortness of breath, no home oxygen, no productive cough, no dry cough, no bronchitis, no wheezing, no hemoptysis, no asthma, no pain with inspiration or cough, no sleep apnea, no CPAP at night  GI:   no difficulty swallowing, no reflux, no frequent heartburn, no hiatal hernia, no abdominal pain, no constipation, no diarrhea, no hematochezia, no hematemesis, no melena  GU:   no dysuria,  no frequency, no urinary tract infection, no hematuria, no enlarged prostate, no kidney stones, no kidney disease  Vascular:  no pain suggestive of claudication, no pain in feet, no leg cramps, no  varicose veins, no DVT, no non-healing foot ulcer  Neuro:   no stroke, no TIA's, no seizures, no headaches, no temporary blindness one eye,  no slurred speech, no peripheral neuropathy, no chronic pain, no instability of gait, no memory/cognitive dysfunction  Musculoskeletal: no arthritis, no joint swelling, no myalgias, no difficulty walking, no mobility   Skin:   no rash, no itching, no skin infections, no pressure sores or ulcerations  Psych:   no anxiety, no depression, no nervousness, no unusual recent stress  Eyes:   no blurry vision, no floaters, no recent vision changes, no wears glasses or contacts  ENT:   no hearing loss, no loose or painful teeth  Hematologic:  no easy  bruising, no abnormal bleeding, no clotting disorder, no frequent epistaxis  Endocrine:  no diabetes, does not check CBG's at home     Physical Exam:   BP 86/49  Pulse 61  Temp 98.6 F (37 C) (Oral)  Resp 17  Ht 5\' 9"  (1.753 m)  Wt 71.9 kg (158 lb 8.2 oz)  BMI 23.41 kg/m2  SpO2 98%  General:    well-appearing  HEENT:  Unremarkable   Neck:   no JVD, no bruits, no adenopathy   Chest:   clear to auscultation, symmetrical breath sounds, no wheezes, no rhonchi   CV:   RRR, no murmur   Abdomen:  soft, non-tender, no masses   Extremities:  warm, well-perfused, pulses palpable  Rectal/GU  Deferred  Neuro:   Grossly non-focal and symmetrical throughout  Skin:   Clean and dry, no rashes, no breakdown  Diagnostic Tests:  Transthoracic Echocardiography  Patient: Tevan, Marian MR #: [id number] Study Date: 10/15/2012 Gender: M Age: 73 Height: 175.3cm Weight: 74.9kg BSA: 1.16m^2 Pt. Status: Room: 2905  PERFORMING [place], [facility] SONOGRAPHER [facility], RDCS ORDERING Gemma Payor cc:  ------------------------------------------------------------ LV EF: 55% - 65%  ------------------------------------------------------------ Indications: MI - acute  410.91.  ------------------------------------------------------------ History: PMH: Chest pain. Risk factors: Current tobacco use.  ------------------------------------------------------------ Study Conclusions  - Left ventricle: The cavity size was normal. Wall thickness was increased in a pattern of mild LVH. Systolic function was normal. The estimated ejection fraction was in the range of 55% to 65%. - Pulmonary arteries: PA peak pressure: 33mm Hg (S). Transthoracic echocardiography. M-mode, complete 2D, spectral Doppler, and color Doppler. Height: Height: 175.3cm. Height: 69in. Weight: Weight: 74.9kg. Weight: 164.8lb. Body mass index: BMI: 24.4kg/m^2. Body surface area: BSA: 1.83m^2. Blood pressure: 127/73. Patient status: Inpatient. Location: ICU/CCU  ------------------------------------------------------------  ------------------------------------------------------------ Left ventricle: The cavity size was normal. Wall thickness was increased in a pattern of mild LVH. Systolic function was normal. The estimated ejection fraction was in the range of 55% to 65%.  ------------------------------------------------------------ Aortic valve: There is a small mobile mass that appears to be attached to the aortic side of the annulusabove the left coronary cusp. The mass measures 0.8 cm x 0.5 cm and is consistent with a vegetation. Clinical correlation needed. It does not appear to be thrombus. Doppler: There was no stenosis. No regurgitation.  ------------------------------------------------------------ Aorta: Aortic root: The aortic root was normal in size. Ascending aorta: The ascending aorta was normal in size.  ------------------------------------------------------------ Mitral valve: Doppler: Trivial regurgitation. Peak gradient: 3mm Hg (D).  ------------------------------------------------------------ Left atrium: The atrium was normal in  size.  ------------------------------------------------------------ Right ventricle: The cavity size was normal. Wall thickness was normal. Systolic function was normal.  ------------------------------------------------------------ Pulmonic valve: Doppler: No significant regurgitation.  ------------------------------------------------------------ Tricuspid valve: Doppler: Mild regurgitation.  ------------------------------------------------------------ Pulmonary artery: Systolic pressure was within the normal range.  ------------------------------------------------------------ Right atrium: The atrium was normal in size.  ------------------------------------------------------------ Pericardium: There was no pericardial effusion.  ------------------------------------------------------------  2D measurements Normal Doppler measurements Normal Left ventricle Main pulmonary LVID ED, 42.8 mm 43-52 artery chord, Pressure, S 33 mm =30 PLAX Hg LVID ES, 32.2 mm 23-38 Left ventricle chord, Ea, lat ann, 11. cm/s ------ PLAX tiss DP 5 FS, chord, 25 % >29 E/Ea, lat 7.8 ------ PLAX ann, tiss DP 5 LVPW, ED 12.2 mm ------ Ea, med ann, 9.6 cm/s ------ IVS/LVPW 1 <1.3 tiss DP 5 ratio, ED E/Ea, med 9.3 ------ Ventricular septum ann, tiss DP 6 IVS, ED 12.2 mm ------ Mitral valve Aorta Peak E  vel 90. cm/s ------ Root diam, 31 mm ------ 3 ED Peak A vel 56. cm/s ------ Left atrium 8 AP dim 38 mm ------ Deceleration 201 ms 150-23 AP dim 1.98 cm/m^2 <2.2 time 0 index Peak 3 mm ------ gradient, D Hg Peak E/A 1.6 ------ ratio Tricuspid valve Regurg peak 241 cm/s ------ vel Peak RV-RA 23 mm ------ gradient, S Hg Systemic veins Estimated CVP 10 mm ------ Hg Right ventricle Pressure, S 33 mm <30 Hg Sa vel, lat 11. cm/s ------ ann, tiss DP 5  ------------------------------------------------------------ Prepared and Electronically Authenticated by  Kristeen Miss 2013-12-01T11:26:[id number]        Transesophageal Echocardiography  Patient: Ameir, Faria MR #: [id number] Study Date: 10/16/2012 Gender: M Age: 41 Height: 172.7cm Weight: 73.2kg BSA: 1.93m^2 Pt. Status: Room: 2905  PERFORMING [place], Peter ATTENDING [place], Artist Pais Hochrein, Bosie Clos SONOGRAPHER Dewitt Hoes, RDCS cc:  ------------------------------------------------------------ LV EF: 55% - 60%  ------------------------------------------------------------ Indications: Endocarditis 421.9.  ------------------------------------------------------------ Study Conclusions  - Left ventricle: The cavity size was normal. Wall thickness was normal. Systolic function was normal. The estimated ejection fraction was in the range of 55% to 60%. - Aortic valve: There is a moderate sized mass attached to the comisure of the left coronary and noncoronary cusps. This mass is peduculated and mobile. It is multlobulated. It measures 1.0x.8 cm and extends above the aortic valve to the sinotubular junction. There is also a small, bright, mobile target attached to the tip of the left coronary leaflet. No aortic insufficiency is seen. No annular abscess is seen. 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 - Mitral valve: No evidence of vegetation. - Left atrium: No evidence of thrombus in the atrial cavity or appendage. No evidence of thrombus in the appendage. - Right atrium: No evidence of thrombus in the atrial cavity or appendage. - Atrial septum: No defect or patent foramen ovale was identified. - Tricuspid valve: No evidence of vegetation. - Pulmonic valve: No evidence of vegetation. Transesophageal echocardiography. 2D and color Doppler. Height: Height: 172.7cm. Height: 68in. Weight: Weight: 73.2kg. Weight: 161lb. Body mass index: BMI: 24.5kg/m^2. Body surface area: BSA: 1.24m^2. Blood  pressure: 115/78. Patient status: Inpatient. Location: ICU/CCU  ------------------------------------------------------------  ------------------------------------------------------------ Left ventricle: The cavity size was normal. Wall thickness was normal. Systolic function was normal. The estimated ejection fraction was in the range of 55% to 60%.  ------------------------------------------------------------ Aortic valve: There is a moderate sized mass attached to the comisure of the left coronary and noncoronary cusps. This mass is peduculated and mobile. It is multlobulated. It measures 1.0x.8 cm and extends above the aortic valve to the sinotubular junction. There is also a small, bright, mobile target attached to the tip of the left coronary leaflet. No aortic insufficiency is seen. No annular abscess is seen. 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 Trileaflet.  ------------------------------------------------------------ Aorta: The aorta was normal, not dilated, and non-diseased.  ------------------------------------------------------------ Mitral valve: Structurally normal valve. Leaflet separation was normal. No evidence of vegetation. Doppler: Trivial regurgitation.  ------------------------------------------------------------ Left atrium: The atrium was normal in size. No evidence of thrombus in the atrial cavity or appendage. No evidence of thrombus in the appendage. The appendage was of normal size. Emptying velocity was normal.  ------------------------------------------------------------ Atrial septum: No defect or patent foramen ovale was identified.  ------------------------------------------------------------ Right ventricle: The cavity size was normal. Wall thickness was normal. Systolic function was normal.  ------------------------------------------------------------ Pulmonic valve: Structurally normal valve. Cusp separation  was normal. No evidence of vegetation.  ------------------------------------------------------------ Tricuspid valve: Structurally normal valve. Leaflet separation was normal. No evidence of vegetation. Doppler: No regurgitation.  ------------------------------------------------------------ Right atrium: The atrium was normal in size. No evidence of thrombus in the atrial cavity or appendage.  ------------------------------------------------------------ Pericardium: There was no pericardial effusion.  ------------------------------------------------------------ Post procedure conclusions Ascending Aorta:  - The aorta was normal, not dilated, and non-diseased.  ------------------------------------------------------------ Prepared and Electronically Authenticated by  [place], Peter 2013-12-02T15:55:45.140   Cardiac CT:  Indication: SEMI Mass on AV unable to assess coronary arteries  invasively  Protocol: The patient was scanned on a Philips 256 scanner. Oral  Atenolol and iv loprsser 5mg  was given. Average HR during scan was  62 bpm. A retrospective 120 kv study was done to capture valve  motion. The patient received 80 cc of contrast injected when the  contrast bolus reached 111 HU's in the descending aorta. The 3D  data set was sent to a [employer]ips work station for review using MIP,  VRT and MPR modes  Findings:  Calcium Score: 0  Coronary Arteries: Right dominant with no anomalies  LM- normal  LAD- normal  D1: small and normal  D2: small and normal  Circumflex: normal  OM1: large branching vessel with high take off normal  AV Groove: small branch normal  RCA: dominant and normal  PDA: small and normal  PLA: normal poorly seen distally  Aorta: Normal aorta with no aneurysm or dissection. Ascending  aorta measures 2.8 cm. The AV was trileaflet with normal motion  and no evidence of abscess There wass a mobile mass between the  noncornary and left coronary cusps  measuring 6mm by 10 mm. Given  the pristine architecture of the valve this may be more consistant  with a fibroelastoma, or Libman Sacks lesion. Given its proximity  to the coronary ostia it is likely the embolic source for the  patients SEMI  Impression:  1) Normal right dominant coronary arteries  2) Normal ascending aorta with no aneurysm or dissection  3) Mobile mass between the noncornary and left coronary cusps  measuring 6mm by 10 mm most consistant with fibroelastoma or libman  sacks lesion  4) Trileaflet AV with no other signs of abscess or endocarditis  5) Calcium score 0  Charlton Haws MD [facility]  Original Report Authenticated By: Charlton Haws, M.D.   Impression:  The patient has a very mobile mass that appears pedunculated it is attached to the aortic valve at the commissure between the left and noncoronary sinus of Valsalva. 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 However, this mass certainly could be a vegetation. The aortic valve function otherwise appears normal and there was no aortic insufficiency. There may be a second small nodular density adherent to the ventricular surface of the non-coronary leaflet of the valve, although I believe this has the appearance more suggestive of a excrescence. The patient presents with a non-ST segment elevation myocardial infarction that most likely represents an embolic event from this mass which appears to be flopping very close to the ostium of the left main coronary artery. Cardiac gated CT angiogram is notable for the absence of any other significant proximal vessel coronary artery disease. Left ventricular function is normal. I agree that surgical intervention is warranted and I feel there is a high likelihood that the aortic valve should be repairable. At  this point I also agree that the added  information gained from preoperative cardiac catheterization does not justified the associated risks given the proximity of this mass to the left main coronary artery.  Plan:  I've discussed the indications, risks, and potential benefits of surgery at length with the patient this afternoon. Alternative surgical approaches have been discussed occluding conventional sternotomy versus the mini thoracotomy approach. I emphasized the fact that I feel there is a high likelihood that his aortic valve should be repairable, but he also understands that there is a chance that his aortic valve might need to be replaced. We discussed alternatives for valve replacement including use of a mechanical prosthesis versus a bioprosthetic tissue valve.  The patient understands and accepts all potential associated risks of surgery including but not limited to risk of death, stroke, myocardial infarction, congestive heart failure, respiratory failure, renal failure, bleeding requiring blood transfusion and/or reexploration, arrhythmia, heart block or bradycardia requiring permanent pacemaker, pneumonia, pleural effusion, wound infection, pulmonary embolus or other thromboembolic complication, chronic pain or other delayed complications including the possibility of late recurrent tumor or infection.  All questions answered.  We tentatively plan to proceed with surgery on Thursday, December 5.     Salvatore Decent. Cornelius Moras, MD 10/17/2012 6:08 PM

## 2012-10-17 NOTE — Progress Notes (Signed)
ANTICOAGULATION CONSULT NOTE - Follow Up Consult  Pharmacy Consult for heparin Indication: chest pain/ACS  Allergies  Allergen Reactions  . Penicillins     Nausea and headache    Patient Measurements: Height: 5\' 9"  (175.3 cm) Weight: 158 lb 8.2 oz (71.9 kg) IBW/kg (Calculated) : 70.7    Vital Signs: Temp: 97.5 F (36.4 C) (12/03 0800) Temp src: Oral (12/03 0800) BP: 110/71 mmHg (12/03 0800) Pulse Rate: 76  (12/03 0800)  Labs:  Basename 10/17/12 0435 10/16/12 0500 10/15/12 2210 10/15/12 1534 10/15/12 0903 10/15/12 0251 10/15/12 0250 10/15/12 0050 10/14/12 1656  HGB 15.7 14.5 -- -- -- -- -- -- --  HCT 44.5 41.5 -- -- -- -- -- 39.7 --  PLT 250 222 -- -- -- -- -- 226 --  APTT -- -- -- -- -- -- -- -- --  LABPROT -- -- -- -- -- -- -- -- --  INR -- -- -- -- -- -- -- -- --  HEPARINUNFRC 0.30 0.36 0.43 -- -- -- -- -- --  CREATININE -- -- -- -- -- 0.86 -- -- 0.93  CKTOTAL -- -- -- -- -- -- -- -- --  CKMB -- -- -- -- -- -- -- -- --  TROPONINI -- -- -- 7.30* 12.80* -- 6.83* -- --    Estimated Creatinine Clearance: 106.2 ml/min (by C-G formula based on Cr of 0.86).   Assessment: Patient is a 46 y.o M on heparin for ACS on admit and now with aortic mass.  Heparin level is therapeutic but at lower end of therapeutic range at 0.30   Goal of Therapy:  Heparin level 0.3-0.7 units/ml Monitor platelets by anticoagulation protocol: Yes   Plan:  1) increase heparin slightly to 1650 units/hr   Jayln Madeira P 10/17/2012,9:14 AM

## 2012-10-18 ENCOUNTER — Inpatient Hospital Stay (HOSPITAL_COMMUNITY): Payer: 59

## 2012-10-18 DIAGNOSIS — I219 Acute myocardial infarction, unspecified: Secondary | ICD-10-CM

## 2012-10-18 DIAGNOSIS — Z01810 Encounter for preprocedural cardiovascular examination: Secondary | ICD-10-CM

## 2012-10-18 LAB — BLOOD GAS, ARTERIAL
Bicarbonate: 21.4 mEq/L (ref 20.0–24.0)
TCO2: 22.6 mmol/L (ref 0–100)
pCO2 arterial: 36.7 mmHg (ref 35.0–45.0)
pH, Arterial: 7.384 (ref 7.350–7.450)
pO2, Arterial: 77.4 mmHg — ABNORMAL LOW (ref 80.0–100.0)

## 2012-10-18 LAB — URINALYSIS, ROUTINE W REFLEX MICROSCOPIC
Bilirubin Urine: NEGATIVE
Glucose, UA: NEGATIVE mg/dL
Hgb urine dipstick: NEGATIVE
Ketones, ur: NEGATIVE mg/dL
Protein, ur: NEGATIVE mg/dL

## 2012-10-18 LAB — COMPREHENSIVE METABOLIC PANEL
ALT: 73 U/L — ABNORMAL HIGH (ref 0–53)
AST: 59 U/L — ABNORMAL HIGH (ref 0–37)
Albumin: 3.8 g/dL (ref 3.5–5.2)
Calcium: 9.6 mg/dL (ref 8.4–10.5)
Potassium: 4.3 mEq/L (ref 3.5–5.1)
Sodium: 137 mEq/L (ref 135–145)
Total Protein: 7.1 g/dL (ref 6.0–8.3)

## 2012-10-18 LAB — CBC
HCT: 44.5 % (ref 39.0–52.0)
Hemoglobin: 15.8 g/dL (ref 13.0–17.0)
WBC: 12.2 10*3/uL — ABNORMAL HIGH (ref 4.0–10.5)

## 2012-10-18 LAB — HEPARIN LEVEL (UNFRACTIONATED): Heparin Unfractionated: 0.4 IU/mL (ref 0.30–0.70)

## 2012-10-18 LAB — TYPE AND SCREEN: Antibody Screen: NEGATIVE

## 2012-10-18 LAB — ABO/RH: ABO/RH(D): O POS

## 2012-10-18 MED ORDER — PHENYLEPHRINE HCL 10 MG/ML IJ SOLN
30.0000 ug/min | INTRAVENOUS | Status: AC
  Administered 2012-10-19: 20 ug/min via INTRAVENOUS
  Filled 2012-10-18: qty 2

## 2012-10-18 MED ORDER — POTASSIUM CHLORIDE 2 MEQ/ML IV SOLN
80.0000 meq | INTRAVENOUS | Status: DC
  Filled 2012-10-18: qty 40

## 2012-10-18 MED ORDER — CHLORHEXIDINE GLUCONATE 4 % EX LIQD
60.0000 mL | Freq: Once | CUTANEOUS | Status: AC
  Administered 2012-10-18: 4 via TOPICAL
  Filled 2012-10-18: qty 60

## 2012-10-18 MED ORDER — EPINEPHRINE HCL 1 MG/ML IJ SOLN
0.5000 ug/min | INTRAVENOUS | Status: DC
  Filled 2012-10-18: qty 4

## 2012-10-18 MED ORDER — CEFUROXIME SODIUM 1.5 G IJ SOLR
1.5000 g | INTRAMUSCULAR | Status: AC
  Administered 2012-10-19: .75 g via INTRAVENOUS
  Administered 2012-10-19: 1.5 g via INTRAVENOUS
  Filled 2012-10-18: qty 1.5

## 2012-10-18 MED ORDER — DOPAMINE-DEXTROSE 3.2-5 MG/ML-% IV SOLN
2.0000 ug/kg/min | INTRAVENOUS | Status: DC
  Filled 2012-10-18: qty 250

## 2012-10-18 MED ORDER — VANCOMYCIN HCL 10 G IV SOLR
1250.0000 mg | INTRAVENOUS | Status: AC
  Administered 2012-10-19: 1250 mg via INTRAVENOUS
  Filled 2012-10-18 (×2): qty 1250

## 2012-10-18 MED ORDER — TEMAZEPAM 15 MG PO CAPS: 15.0000 mg | ORAL_CAPSULE | Freq: Once | ORAL | Status: AC | PRN

## 2012-10-18 MED ORDER — MAGNESIUM SULFATE 50 % IJ SOLN
40.0000 meq | INTRAMUSCULAR | Status: DC
  Filled 2012-10-18: qty 10

## 2012-10-18 MED ORDER — NITROGLYCERIN IN D5W 200-5 MCG/ML-% IV SOLN
2.0000 ug/min | INTRAVENOUS | Status: DC
  Filled 2012-10-18: qty 250

## 2012-10-18 MED ORDER — METOPROLOL TARTRATE 12.5 MG HALF TABLET
12.5000 mg | ORAL_TABLET | Freq: Once | ORAL | Status: AC
  Administered 2012-10-19: 12.5 mg via ORAL
  Filled 2012-10-18: qty 1

## 2012-10-18 MED ORDER — PLASMA-LYTE 148 IV SOLN
INTRAVENOUS | Status: AC
  Administered 2012-10-19: 15:00:00
  Filled 2012-10-18: qty 2.5

## 2012-10-18 MED ORDER — BISACODYL 5 MG PO TBEC
5.0000 mg | DELAYED_RELEASE_TABLET | Freq: Once | ORAL | Status: AC
  Administered 2012-10-18: 5 mg via ORAL
  Filled 2012-10-18 (×2): qty 1

## 2012-10-18 MED ORDER — DEXTROSE 5 % IV SOLN
750.0000 mg | INTRAVENOUS | Status: DC
  Filled 2012-10-18: qty 750

## 2012-10-18 MED ORDER — INSULIN REGULAR HUMAN 100 UNIT/ML IJ SOLN
INTRAMUSCULAR | Status: AC
  Administered 2012-10-19: 1.5 [IU]/h via INTRAVENOUS
  Filled 2012-10-18: qty 1

## 2012-10-18 MED ORDER — DEXMEDETOMIDINE HCL IN NACL 400 MCG/100ML IV SOLN
0.1000 ug/kg/h | INTRAVENOUS | Status: AC
  Administered 2012-10-19: 0.2 ug/kg/h via INTRAVENOUS
  Filled 2012-10-18: qty 100

## 2012-10-18 MED ORDER — SODIUM CHLORIDE 0.9 % IV SOLN
INTRAVENOUS | Status: AC
  Administered 2012-10-19 (×2): 14 mL/h via INTRAVENOUS
  Filled 2012-10-18: qty 40

## 2012-10-18 NOTE — Progress Notes (Signed)
    SUBJECTIVE:  No chest pain.  No SOB   PHYSICAL EXAM Filed Vitals:   10/18/12 0300 10/18/12 0400 10/18/12 0500 10/18/12 0600  BP: 85/59 96/63 94/39  99/59  Pulse: 58 59 66 67  Temp:  97.7 F (36.5 C)    TempSrc:  Oral    Resp:      Height:      Weight:   160 lb 4.4 oz (72.7 kg)   SpO2: 95% 95% 97% 95%   General:  No distress Lungs:  Clear Heart:  RRR Abdomen:  Positive bowel sounds, no rebound no guarding Extremities:  No edema  LABS: Lab Results  Component Value Date   TROPONINI 7.30* 10/15/2012   Results for orders placed during the hospital encounter of 10/14/12 (from the past 24 hour(s))  HEPARIN LEVEL (UNFRACTIONATED)     Status: Normal   Collection Time   10/18/12  5:25 AM      Component Value Range   Heparin Unfractionated 0.40  0.30 - 0.70 IU/mL  CBC     Status: Abnormal   Collection Time   10/18/12  5:25 AM      Component Value Range   WBC 12.2 (*) 4.0 - 10.5 K/uL   RBC 5.10  4.22 - 5.81 MIL/uL   Hemoglobin 15.8  13.0 - 17.0 g/dL   HCT 16.1  09.6 - 04.5 %   MCV 87.3  78.0 - 100.0 fL   MCH 31.0  26.0 - 34.0 pg   MCHC 35.5  30.0 - 36.0 g/dL   RDW 40.9  81.1 - 91.4 %   Platelets 236  150 - 400 K/uL    Intake/Output Summary (Last 24 hours) at 10/18/12 7829 Last data filed at 10/18/12 0600  Gross per 24 hour  Intake 2204.08 ml  Output   2500 ml  Net -295.92 ml    ASSESSMENT AND PLAN:  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  Cultures thus far negative and no other evidence of endocarditis.  Antiphospholipid antibodies pending.  Plan is for OR in the am.  I will leave him in the ICU and on heparin  NQWMI I suspect an embolic event.  No evidence of obstructive CAD on CT though distal vessels not well visualized.    HYPERTRIGLYCERIDEMIA Continue statin.  Dietary consult completed. I would like to report an EKG  TOBACCO ABUSE Nicotine patch and Xanax started.      Fayrene Fearing [facility] 10/18/2012 6:29 AM

## 2012-10-18 NOTE — Progress Notes (Signed)
ANTICOAGULATION CONSULT NOTE - Follow Up Consult  Pharmacy Consult for heparin Indication: chest pain/ACS  Allergies  Allergen Reactions  . Penicillins     Nausea and headache    Patient Measurements: Height: 5\' 9"  (175.3 cm) Weight: 160 lb 4.4 oz (72.7 kg) IBW/kg (Calculated) : 70.7    Vital Signs: Temp: 98 F (36.7 C) (12/04 0700) Temp src: Oral (12/04 0700) BP: 108/70 mmHg (12/04 0800) Pulse Rate: 66  (12/04 0800)  Labs:  Basename 10/18/12 0525 10/17/12 0435 10/16/12 0500 10/15/12 1534  HGB 15.8 15.7 -- --  HCT 44.5 44.5 41.5 --  PLT 236 250 222 --  APTT -- -- -- --  LABPROT -- -- -- --  INR -- -- -- --  HEPARINUNFRC 0.40 0.30 0.36 --  CREATININE -- -- -- --  CKTOTAL -- -- -- --  CKMB -- -- -- --  TROPONINI -- -- -- 7.30*    Estimated Creatinine Clearance: 106.2 ml/min (by C-G formula based on Cr of 0.86).   Assessment: Patient is a 46 y.o M on heparin for ACS on admit and now with aortic mass. Pt to go to surgery tomorrow (12/5) to repair the aorta.  Heparin level is therapeutic at 0.4 today. CBC stable. No bleeding noted.       Goal of Therapy:  Heparin level 0.3-0.7 units/ml Monitor platelets by anticoagulation protocol: Yes   Plan:  Continue heparin at 1650 units/hr. Daily CBC and heparin level.   Doris Cheadle, PharmD Clinical Pharmacist Pager: [phone] Phone: [phone] 10/18/2012 9:11 AM

## 2012-10-18 NOTE — Progress Notes (Signed)
TCTS BRIEF PROGRESS NOTE   I have again reviewed the indications, risks and potential benefits of surgery with Andrew Le.  In the event that his aortic valve is not repairable he specifically requests that I replace it using a mechanical prosthesis.  Hopefully this will not be the case.  He understands and accepts all potential associated risks of surgery including but not limited to risk of death, stroke, myocardial infarction, congestive heart failure, respiratory failure, renal failure, bleeding requiring blood transfusion and/or reexploration, arrhythmia, heart block or bradycardia requiring permanent pacemaker, pneumonia, pleural effusion, wound infection, pulmonary embolus or other thromboembolic complication, chronic pain or other delayed complications.  All questions answered.   [name],[name] 10/18/2012

## 2012-10-18 NOTE — Progress Notes (Signed)
Pre-op Cardiac Surgery  Carotid Findings:  Bilateral: No evidence of hemodynamically significant internal carotid artery stenosis.     Upper Extremity Right Left  Brachial Pressures 117 108  Radial Waveforms Bi Tri  Ulnar Waveforms Tri Tri  Palmar Arch (Allen's Test) Normal with radial compression, obliterates with ulnar compression Normal with radial compression, obliterates with ulnar compression    Farrel Demark, RDMS, RVT 10/18/2012

## 2012-10-19 ENCOUNTER — Inpatient Hospital Stay (HOSPITAL_COMMUNITY): Payer: MEDICAID | Admitting: Anesthesiology

## 2012-10-19 ENCOUNTER — Inpatient Hospital Stay (HOSPITAL_COMMUNITY): Payer: 59

## 2012-10-19 ENCOUNTER — Encounter (HOSPITAL_COMMUNITY): Payer: Self-pay | Admitting: Anesthesiology

## 2012-10-19 ENCOUNTER — Encounter (HOSPITAL_COMMUNITY)
Admission: EM | Disposition: A | Discharge status: Home / Self Care | Payer: Self-pay | Source: Home / Self Care | Attending: Cardiology

## 2012-10-19 ENCOUNTER — Encounter (HOSPITAL_COMMUNITY): Payer: Self-pay | Admitting: Thoracic Surgery (Cardiothoracic Vascular Surgery)

## 2012-10-19 DIAGNOSIS — D487 Neoplasm of uncertain behavior of other specified sites: Secondary | ICD-10-CM

## 2012-10-19 DIAGNOSIS — Z9889 Other specified postprocedural states: Secondary | ICD-10-CM

## 2012-10-19 DIAGNOSIS — I359 Nonrheumatic aortic valve disorder, unspecified: Secondary | ICD-10-CM

## 2012-10-19 HISTORY — PX: INTRAOPERATIVE TRANSESOPHAGEAL ECHOCARDIOGRAM: SHX5062

## 2012-10-19 HISTORY — PX: AORTIC VALVE REPAIR: SHX6306

## 2012-10-19 HISTORY — DX: Other specified postprocedural states: Z98.890

## 2012-10-19 LAB — POCT I-STAT 3, ART BLOOD GAS (G3+)
Acid-base deficit: 2 mmol/L (ref 0.0–2.0)
Acid-base deficit: 3 mmol/L — ABNORMAL HIGH (ref 0.0–2.0)
Patient temperature: 35.7
Patient temperature: 36.7
Patient temperature: 36.7
TCO2: 21 mmol/L (ref 0–100)
TCO2: 23 mmol/L (ref 0–100)
pH, Arterial: 7.362 (ref 7.350–7.450)
pH, Arterial: 7.373 (ref 7.350–7.450)
pH, Arterial: 7.381 (ref 7.350–7.450)
pO2, Arterial: 216 mmHg — ABNORMAL HIGH (ref 80.0–100.0)

## 2012-10-19 LAB — POCT I-STAT, CHEM 8
Calcium, Ion: 1.15 mmol/L (ref 1.12–1.23)
Glucose, Bld: 109 mg/dL — ABNORMAL HIGH (ref 70–99)
HCT: 36 % — ABNORMAL LOW (ref 39.0–52.0)
Hemoglobin: 12.2 g/dL — ABNORMAL LOW (ref 13.0–17.0)
TCO2: 21 mmol/L (ref 0–100)

## 2012-10-19 LAB — CBC
HCT: 45.4 % (ref 39.0–52.0)
Hemoglobin: 15.7 g/dL (ref 13.0–17.0)
Hemoglobin: 12.7 g/dL — ABNORMAL LOW (ref 13.0–17.0)
MCH: 30.3 pg (ref 26.0–34.0)
MCHC: 34.6 g/dL (ref 30.0–36.0)
MCV: 87.5 fL (ref 78.0–100.0)
MCV: 86.5 fL (ref 78.0–100.0)
Platelets: 157 10*3/uL (ref 150–400)
RBC: 4.37 MIL/uL (ref 4.22–5.81)
RBC: 4.06 MIL/uL — ABNORMAL LOW (ref 4.22–5.81)
RDW: 12.5 % (ref 11.5–15.5)
WBC: 19.6 10*3/uL — ABNORMAL HIGH (ref 4.0–10.5)
WBC: 26.2 10*3/uL — ABNORMAL HIGH (ref 4.0–10.5)

## 2012-10-19 LAB — POCT I-STAT 4, (NA,K, GLUC, HGB,HCT)
Glucose, Bld: 96 mg/dL (ref 70–99)
Glucose, Bld: 105 mg/dL — ABNORMAL HIGH (ref 70–99)
HCT: 37 % — ABNORMAL LOW (ref 39.0–52.0)
HCT: 37 % — ABNORMAL LOW (ref 39.0–52.0)
Hemoglobin: 10.2 g/dL — ABNORMAL LOW (ref 13.0–17.0)
Potassium: 3.8 mEq/L (ref 3.5–5.1)
Potassium: 5.1 mEq/L (ref 3.5–5.1)
Sodium: 137 mEq/L (ref 135–145)
Sodium: 129 mEq/L — ABNORMAL LOW (ref 135–145)

## 2012-10-19 LAB — GRAM STAIN

## 2012-10-19 LAB — APTT: aPTT: 37 seconds (ref 24–37)

## 2012-10-19 LAB — BASIC METABOLIC PANEL
BUN: 12 mg/dL (ref 6–23)
Calcium: 9.9 mg/dL (ref 8.4–10.5)
Creatinine, Ser: 0.96 mg/dL (ref 0.50–1.35)
GFR calc non Af Amer: >90 mL/min (ref >90)
Glucose, Bld: 99 mg/dL (ref 70–99)
Potassium: 4.8 mEq/L (ref 3.5–5.1)

## 2012-10-19 LAB — MAGNESIUM: Magnesium: 2.6 mg/dL — ABNORMAL HIGH (ref 1.5–2.5)

## 2012-10-19 LAB — CREATININE, SERUM
Creatinine, Ser: 0.82 mg/dL (ref 0.50–1.35)
GFR calc Af Amer: >90 mL/min (ref >90)
GFR calc non Af Amer: >90 mL/min (ref >90)

## 2012-10-19 LAB — PROTIME-INR: Prothrombin Time: 15.6 seconds — ABNORMAL HIGH (ref 11.6–15.2)

## 2012-10-19 LAB — HEMOGLOBIN AND HEMATOCRIT, BLOOD: HCT: 27.5 % — ABNORMAL LOW (ref 39.0–52.0)

## 2012-10-19 LAB — GLUCOSE, CAPILLARY: Glucose-Capillary: 107 mg/dL — ABNORMAL HIGH (ref 70–99)

## 2012-10-19 SURGERY — ECHOCARDIOGRAM, TRANSESOPHAGEAL, INTRAOPERATIVE
Anesthesia: General | Site: Chest

## 2012-10-19 MED ORDER — INSULIN REGULAR BOLUS VIA INFUSION
0.0000 [IU] | Freq: Three times a day (TID) | INTRAVENOUS | Status: DC
  Filled 2012-10-19: qty 10

## 2012-10-19 MED ORDER — ACETAMINOPHEN 10 MG/ML IV SOLN
1000.0000 mg | Freq: Once | INTRAVENOUS | Status: AC
  Administered 2012-10-19: 1000 mg via INTRAVENOUS
  Filled 2012-10-19: qty 100

## 2012-10-19 MED ORDER — BISACODYL 5 MG PO TBEC
10.0000 mg | DELAYED_RELEASE_TABLET | Freq: Every day | ORAL | Status: DC
  Administered 2012-10-20 – 2012-10-21 (×2): 10 mg via ORAL
  Filled 2012-10-19: qty 2
  Filled 2012-10-19: qty 1

## 2012-10-19 MED ORDER — PANTOPRAZOLE SODIUM 40 MG PO TBEC
40.0000 mg | DELAYED_RELEASE_TABLET | Freq: Every day | ORAL | Status: DC
  Administered 2012-10-20 – 2012-10-22 (×3): 40 mg via ORAL
  Filled 2012-10-19: qty 1

## 2012-10-19 MED ORDER — SODIUM CHLORIDE 0.9 % IV SOLN
INTRAVENOUS | Status: DC
  Administered 2012-10-19: 20 mL/h via INTRAVENOUS

## 2012-10-19 MED ORDER — INSULIN ASPART 100 UNIT/ML ~~LOC~~ SOLN
0.0000 [IU] | SUBCUTANEOUS | Status: DC
  Administered 2012-10-20 (×2): 2 [IU] via SUBCUTANEOUS

## 2012-10-19 MED ORDER — MIDAZOLAM HCL 2 MG/2ML IJ SOLN: 2.0000 mg | INTRAMUSCULAR | Status: DC | PRN

## 2012-10-19 MED ORDER — METOPROLOL TARTRATE 12.5 MG HALF TABLET
12.5000 mg | ORAL_TABLET | Freq: Two times a day (BID) | ORAL | Status: DC
  Administered 2012-10-20 – 2012-10-21 (×4): 12.5 mg via ORAL
  Filled 2012-10-19 (×8): qty 1

## 2012-10-19 MED ORDER — FENTANYL CITRATE 0.05 MG/ML IJ SOLN
INTRAMUSCULAR | Status: DC | PRN
  Administered 2012-10-19: 900 ug via INTRAVENOUS
  Administered 2012-10-19: 250 ug via INTRAVENOUS
  Administered 2012-10-19: 100 ug via INTRAVENOUS

## 2012-10-19 MED ORDER — ASPIRIN 81 MG PO CHEW: 324.0000 mg | CHEWABLE_TABLET | Freq: Every day | ORAL | Status: DC

## 2012-10-19 MED ORDER — PROTAMINE SULFATE 10 MG/ML IV SOLN
INTRAVENOUS | Status: DC | PRN
  Administered 2012-10-19: 210 mg via INTRAVENOUS

## 2012-10-19 MED ORDER — SODIUM CHLORIDE 0.9 % IR SOLN
Status: DC | PRN
  Administered 2012-10-19: 1000 mL
  Administered 2012-10-19: 3000 mL

## 2012-10-19 MED ORDER — MAGNESIUM SULFATE 40 MG/ML IJ SOLN
4.0000 g | Freq: Once | INTRAMUSCULAR | Status: AC
  Administered 2012-10-19: 4 g via INTRAVENOUS
  Filled 2012-10-19: qty 100

## 2012-10-19 MED ORDER — SODIUM CHLORIDE 0.9 % IJ SOLN: 3.0000 mL | Freq: Two times a day (BID) | INTRAMUSCULAR | Status: DC

## 2012-10-19 MED ORDER — VECURONIUM BROMIDE 10 MG IV SOLR
INTRAVENOUS | Status: DC | PRN
  Administered 2012-10-19 (×2): 5 mg via INTRAVENOUS

## 2012-10-19 MED ORDER — HEPARIN SODIUM (PORCINE) 1000 UNIT/ML IJ SOLN
INTRAMUSCULAR | Status: DC | PRN
  Administered 2012-10-19: 21000 [IU] via INTRAVENOUS

## 2012-10-19 MED ORDER — CHLORHEXIDINE GLUCONATE 4 % EX LIQD
CUTANEOUS | Status: AC
  Filled 2012-10-19: qty 60

## 2012-10-19 MED ORDER — DEXTROSE 5 % IV SOLN
1.5000 g | Freq: Two times a day (BID) | INTRAVENOUS | Status: DC
  Filled 2012-10-19: qty 1.5

## 2012-10-19 MED ORDER — ALBUMIN HUMAN 5 % IV SOLN
250.0000 mL | INTRAVENOUS | Status: DC | PRN
  Administered 2012-10-19 (×3): 250 mL via INTRAVENOUS
  Filled 2012-10-19: qty 250

## 2012-10-19 MED ORDER — ROCURONIUM BROMIDE 100 MG/10ML IV SOLN
INTRAVENOUS | Status: DC | PRN
  Administered 2012-10-19: 50 mg via INTRAVENOUS

## 2012-10-19 MED ORDER — SODIUM CHLORIDE 0.9 % IV SOLN: 250.0000 mL | INTRAVENOUS | Status: DC

## 2012-10-19 MED ORDER — VANCOMYCIN HCL IN DEXTROSE 1-5 GM/200ML-% IV SOLN
1000.0000 mg | Freq: Once | INTRAVENOUS | Status: AC
  Administered 2012-10-20: 1000 mg via INTRAVENOUS
  Filled 2012-10-19: qty 200

## 2012-10-19 MED ORDER — CHLORHEXIDINE GLUCONATE 4 % EX LIQD: Freq: Once | CUTANEOUS | Status: DC

## 2012-10-19 MED ORDER — AMINOCAPROIC ACID 250 MG/ML IV SOLN
INTRAVENOUS | Status: DC | PRN
  Administered 2012-10-19: 5 g via INTRAVENOUS

## 2012-10-19 MED ORDER — DOCUSATE SODIUM 100 MG PO CAPS
200.0000 mg | ORAL_CAPSULE | Freq: Every day | ORAL | Status: DC
  Administered 2012-10-20 – 2012-10-22 (×3): 200 mg via ORAL
  Filled 2012-10-19 (×4): qty 2

## 2012-10-19 MED ORDER — ACETAMINOPHEN 160 MG/5ML PO SOLN: 975.0000 mg | Freq: Four times a day (QID) | ORAL | Status: DC

## 2012-10-19 MED ORDER — SODIUM CHLORIDE 0.45 % IV SOLN
INTRAVENOUS | Status: DC
  Administered 2012-10-19: 20 mL/h via INTRAVENOUS

## 2012-10-19 MED ORDER — MORPHINE SULFATE 2 MG/ML IJ SOLN
2.0000 mg | INTRAMUSCULAR | Status: DC | PRN
  Administered 2012-10-19 – 2012-10-20 (×3): 2 mg via INTRAVENOUS
  Administered 2012-10-20: 4 mg via INTRAVENOUS
  Filled 2012-10-19 (×2): qty 1
  Filled 2012-10-19: qty 2
  Filled 2012-10-19: qty 1
  Filled 2012-10-19: qty 2

## 2012-10-19 MED ORDER — METOPROLOL TARTRATE 25 MG/10 ML ORAL SUSPENSION
12.5000 mg | Freq: Two times a day (BID) | ORAL | Status: DC
  Filled 2012-10-19 (×3): qty 5

## 2012-10-19 MED ORDER — PHENYLEPHRINE HCL 10 MG/ML IJ SOLN
0.0000 ug/min | INTRAVENOUS | Status: DC
  Filled 2012-10-19: qty 2

## 2012-10-19 MED ORDER — NITROGLYCERIN IN D5W 200-5 MCG/ML-% IV SOLN: 0.0000 ug/min | INTRAVENOUS | Status: DC

## 2012-10-19 MED ORDER — MIDAZOLAM HCL 5 MG/5ML IJ SOLN
INTRAMUSCULAR | Status: DC | PRN
  Administered 2012-10-19: 2 mg via INTRAVENOUS
  Administered 2012-10-19 (×2): 3 mg via INTRAVENOUS
  Administered 2012-10-19: 4 mg via INTRAVENOUS

## 2012-10-19 MED ORDER — POTASSIUM CHLORIDE 10 MEQ/50ML IV SOLN: 10.0000 meq | INTRAVENOUS | Status: DC

## 2012-10-19 MED ORDER — SODIUM CHLORIDE 0.9 % IJ SOLN
OROMUCOSAL | Status: DC | PRN
  Administered 2012-10-19: 13:00:00 via TOPICAL

## 2012-10-19 MED ORDER — ACETAMINOPHEN 500 MG PO TABS
1000.0000 mg | ORAL_TABLET | Freq: Four times a day (QID) | ORAL | Status: DC
  Administered 2012-10-20 – 2012-10-23 (×13): 1000 mg via ORAL
  Filled 2012-10-19 (×17): qty 2

## 2012-10-19 MED ORDER — ASPIRIN EC 325 MG PO TBEC
325.0000 mg | DELAYED_RELEASE_TABLET | Freq: Every day | ORAL | Status: DC
  Administered 2012-10-20 – 2012-10-23 (×4): 325 mg via ORAL
  Filled 2012-10-19 (×4): qty 1

## 2012-10-19 MED ORDER — GLUTARALDEHYDE 0.625% SOAKING SOLUTION
TOPICAL | Status: DC | PRN
  Filled 2012-10-19: qty 50

## 2012-10-19 MED ORDER — PROPOFOL 10 MG/ML IV BOLUS
INTRAVENOUS | Status: DC | PRN
  Administered 2012-10-19: 30 mg via INTRAVENOUS

## 2012-10-19 MED ORDER — LACTATED RINGERS IV SOLN
INTRAVENOUS | Status: DC | PRN
  Administered 2012-10-19 (×2): via INTRAVENOUS

## 2012-10-19 MED ORDER — ONDANSETRON HCL 4 MG/2ML IJ SOLN
4.0000 mg | Freq: Four times a day (QID) | INTRAMUSCULAR | Status: DC | PRN
  Administered 2012-10-19: 4 mg via INTRAVENOUS
  Filled 2012-10-19: qty 2

## 2012-10-19 MED ORDER — ARTIFICIAL TEARS OP OINT
TOPICAL_OINTMENT | OPHTHALMIC | Status: DC | PRN
  Administered 2012-10-19: 1 via OPHTHALMIC

## 2012-10-19 MED ORDER — CHLORHEXIDINE GLUCONATE 4 % EX LIQD
60.0000 mL | Freq: Once | CUTANEOUS | Status: AC
  Administered 2012-10-19: 4 via TOPICAL

## 2012-10-19 MED ORDER — BISACODYL 10 MG RE SUPP: 10.0000 mg | Freq: Every day | RECTAL | Status: DC

## 2012-10-19 MED ORDER — METOPROLOL TARTRATE 1 MG/ML IV SOLN
2.5000 mg | INTRAVENOUS | Status: DC | PRN
  Administered 2012-10-20: 5 mg via INTRAVENOUS
  Administered 2012-10-20: 2.5 mg via INTRAVENOUS
  Filled 2012-10-19: qty 5

## 2012-10-19 MED ORDER — SODIUM CHLORIDE 0.9 % IV SOLN
INTRAVENOUS | Status: DC
  Filled 2012-10-19: qty 1

## 2012-10-19 MED ORDER — MORPHINE SULFATE 2 MG/ML IJ SOLN
1.0000 mg | INTRAMUSCULAR | Status: AC | PRN
Start: 2012-10-19 — End: 2012-10-20
  Administered 2012-10-19 (×2): 2 mg via INTRAVENOUS

## 2012-10-19 MED ORDER — SODIUM CHLORIDE 0.9 % IJ SOLN: 3.0000 mL | INTRAMUSCULAR | Status: DC | PRN

## 2012-10-19 MED ORDER — FAMOTIDINE IN NACL 20-0.9 MG/50ML-% IV SOLN
20.0000 mg | Freq: Two times a day (BID) | INTRAVENOUS | Status: AC
  Administered 2012-10-19 (×2): 20 mg via INTRAVENOUS
  Filled 2012-10-19: qty 50

## 2012-10-19 MED ORDER — LACTATED RINGERS IV SOLN
INTRAVENOUS | Status: DC | PRN
  Administered 2012-10-19: 13:00:00 via INTRAVENOUS

## 2012-10-19 MED ORDER — LIDOCAINE HCL (CARDIAC) 20 MG/ML IV SOLN
INTRAVENOUS | Status: DC | PRN
  Administered 2012-10-19: 30 mg via INTRAVENOUS

## 2012-10-19 MED ORDER — CALCIUM CHLORIDE 10 % IV SOLN
1.0000 g | Freq: Once | INTRAVENOUS | Status: AC | PRN
  Filled 2012-10-19: qty 10

## 2012-10-19 MED ORDER — OXYCODONE HCL 5 MG PO TABS
5.0000 mg | ORAL_TABLET | ORAL | Status: DC | PRN
Start: 2012-10-19 — End: 2012-10-23
  Administered 2012-10-20: 5 mg via ORAL
  Administered 2012-10-20 (×3): 10 mg via ORAL
  Administered 2012-10-20: 5 mg via ORAL
  Administered 2012-10-20 – 2012-10-22 (×6): 10 mg via ORAL
  Filled 2012-10-19 (×2): qty 2
  Filled 2012-10-19: qty 1
  Filled 2012-10-19: qty 2
  Filled 2012-10-19: qty 1
  Filled 2012-10-19 (×6): qty 2

## 2012-10-19 MED ORDER — INSULIN ASPART 100 UNIT/ML ~~LOC~~ SOLN
0.0000 [IU] | SUBCUTANEOUS | Status: AC
  Administered 2012-10-19 – 2012-10-20 (×3): 2 [IU] via SUBCUTANEOUS

## 2012-10-19 MED ORDER — DEXMEDETOMIDINE HCL IN NACL 200 MCG/50ML IV SOLN: 0.1000 ug/kg/h | INTRAVENOUS | Status: DC

## 2012-10-19 MED ORDER — LACTATED RINGERS IV SOLN: INTRAVENOUS | Status: DC

## 2012-10-19 SURGICAL SUPPLY — 84 items
ADAPTER CARDIO PERF ANTE/RETRO (ADAPTER) ×3 IMPLANT
ATTRACTOMAT 16X20 MAGNETIC DRP (DRAPES) ×3 IMPLANT
BLADE STERNUM SYSTEM 6 (BLADE) ×3 IMPLANT
BLADE SURG 11 STRL SS (BLADE) ×3 IMPLANT
CANISTER SUCTION 2500CC (MISCELLANEOUS) ×3 IMPLANT
CANNULA GUNDRY RCSP 15FR (MISCELLANEOUS) ×3 IMPLANT
CANNULA SOFTFLOW AORTIC 7M21FR (CANNULA) ×3 IMPLANT
CATH CPB KIT OWEN (MISCELLANEOUS) ×3 IMPLANT
CATH HEART VENT LEFT (CATHETERS) ×2 IMPLANT
CATH THORACIC 36FR (CATHETERS) ×3 IMPLANT
CATH THORACIC 36FR RT ANG (CATHETERS) IMPLANT
CLOTH BEACON ORANGE TIMEOUT ST (SAFETY) ×3 IMPLANT
CONT SPECI 4OZ STER CLIK (MISCELLANEOUS) ×3 IMPLANT
COVER SURGICAL LIGHT HANDLE (MISCELLANEOUS) ×3 IMPLANT
CRADLE DONUT ADULT HEAD (MISCELLANEOUS) ×3 IMPLANT
DRAIN CHANNEL 28F RND 3/8 FF (WOUND CARE) ×3 IMPLANT
DRAIN CHANNEL 32F RND 10.7 FF (WOUND CARE) ×3 IMPLANT
DRAPE INCISE IOBAN 66X45 STRL (DRAPES) ×3 IMPLANT
DRAPE SLUSH/WARMER DISC (DRAPES) ×3 IMPLANT
DRSG COVADERM 4X14 (GAUZE/BANDAGES/DRESSINGS) ×3 IMPLANT
ELECT REM PT RETURN 9FT ADLT (ELECTROSURGICAL) ×6
ELECTRODE REM PT RTRN 9FT ADLT (ELECTROSURGICAL) ×4 IMPLANT
GLOVE BIO SURGEON STRL SZ 6 (GLOVE) ×3 IMPLANT
GLOVE BIO SURGEON STRL SZ 6.5 (GLOVE) IMPLANT
GLOVE BIO SURGEON STRL SZ7 (GLOVE) ×6 IMPLANT
GLOVE BIO SURGEON STRL SZ7.5 (GLOVE) IMPLANT
GLOVE BIOGEL PI IND STRL 6 (GLOVE) ×6 IMPLANT
GLOVE BIOGEL PI IND STRL 7.0 (GLOVE) ×8 IMPLANT
GLOVE BIOGEL PI INDICATOR 6 (GLOVE) ×3
GLOVE BIOGEL PI INDICATOR 7.0 (GLOVE) ×4
GLOVE ORTHO TXT STRL SZ7.5 (GLOVE) ×9 IMPLANT
GOWN STRL NON-REIN LRG LVL3 (GOWN DISPOSABLE) ×18 IMPLANT
HEMOSTAT POWDER SURGIFOAM 1G (HEMOSTASIS) ×9 IMPLANT
INSERT FOGARTY XLG (MISCELLANEOUS) ×3 IMPLANT
KIT BASIN OR (CUSTOM PROCEDURE TRAY) ×3 IMPLANT
KIT DRAINAGE VACCUM ASSIST (KITS) ×3 IMPLANT
KIT ROOM TURNOVER OR (KITS) ×3 IMPLANT
KIT SUCTION CATH 14FR (SUCTIONS) ×9 IMPLANT
LEAD PACING MYOCARDI (MISCELLANEOUS) ×3 IMPLANT
LINE VENT (MISCELLANEOUS) ×3 IMPLANT
NS IRRIG 1000ML POUR BTL (IV SOLUTION) ×15 IMPLANT
PACK OPEN HEART (CUSTOM PROCEDURE TRAY) ×3 IMPLANT
PAD ARMBOARD 7.5X6 YLW CONV (MISCELLANEOUS) ×6 IMPLANT
SET CARDIOPLEGIA MPS 5001102 (MISCELLANEOUS) ×3 IMPLANT
SET IRRIG TUBING LAPAROSCOPIC (IRRIGATION / IRRIGATOR) ×3 IMPLANT
SOLUTION ANTI FOG 6CC (MISCELLANEOUS) ×3 IMPLANT
SPONGE GAUZE 4X4 12PLY (GAUZE/BANDAGES/DRESSINGS) ×3 IMPLANT
SUT BONE WAX W31G (SUTURE) ×3 IMPLANT
SUT ETHIBON 2 0 V 52N 30 (SUTURE) ×6 IMPLANT
SUT ETHIBON EXCEL 2-0 V-5 (SUTURE) IMPLANT
SUT ETHIBOND 2 0 SH (SUTURE)
SUT ETHIBOND 2 0 SH 36X2 (SUTURE) IMPLANT
SUT ETHIBOND 2 0 V4 (SUTURE) IMPLANT
SUT ETHIBOND 2 0V4 GREEN (SUTURE) IMPLANT
SUT ETHIBOND 4 0 RB 1 (SUTURE) IMPLANT
SUT ETHIBOND V-5 VALVE (SUTURE) IMPLANT
SUT ETHIBOND X763 2 0 SH 1 (SUTURE) ×9 IMPLANT
SUT MNCRL AB 3-0 PS2 18 (SUTURE) ×6 IMPLANT
SUT PDS AB 1 CTX 36 (SUTURE) ×6 IMPLANT
SUT PROLENE 4 0 RB 1 (SUTURE) ×5
SUT PROLENE 4 0 SH DA (SUTURE) ×9 IMPLANT
SUT PROLENE 4-0 RB1 .5 CRCL 36 (SUTURE) ×10 IMPLANT
SUT PROLENE 5 0 C 1 36 (SUTURE) IMPLANT
SUT PROLENE 6 0 C 1 30 (SUTURE) IMPLANT
SUT SILK  1 MH (SUTURE) ×1
SUT SILK 1 MH (SUTURE) ×2 IMPLANT
SUT SILK 2 0 SH CR/8 (SUTURE) IMPLANT
SUT SILK 3 0 SH CR/8 (SUTURE) IMPLANT
SUT STEEL 6MS V (SUTURE) ×3 IMPLANT
SUT STEEL STERNAL CCS#1 18IN (SUTURE) ×3 IMPLANT
SUT STEEL SZ 6 DBL 3X14 BALL (SUTURE) ×3 IMPLANT
SUT VIC AB 2-0 CTX 27 (SUTURE) IMPLANT
SUTURE E-PAK OPEN HEART (SUTURE) ×3 IMPLANT
SWAB COLLECTION DEVICE MRSA (MISCELLANEOUS) ×3 IMPLANT
SYSTEM SAHARA CHEST DRAIN ATS (WOUND CARE) ×3 IMPLANT
TAPE CLOTH SURG 4X10 WHT LF (GAUZE/BANDAGES/DRESSINGS) ×3 IMPLANT
TOWEL OR 17X24 6PK STRL BLUE (TOWEL DISPOSABLE) ×6 IMPLANT
TOWEL OR 17X26 10 PK STRL BLUE (TOWEL DISPOSABLE) ×6 IMPLANT
TRAY FOLEY IC TEMP SENS 14FR (CATHETERS) ×3 IMPLANT
TUBE ANAEROBIC SPECIMEN COL (MISCELLANEOUS) ×3 IMPLANT
TUBE SUCT INTRACARD DLP 20F (MISCELLANEOUS) ×3 IMPLANT
UNDERPAD 30X30 INCONTINENT (UNDERPADS AND DIAPERS) ×6 IMPLANT
VENT LEFT HEART 12002 (CATHETERS) ×3
WATER STERILE IRR 1000ML POUR (IV SOLUTION) ×6 IMPLANT

## 2012-10-19 NOTE — Anesthesia Procedure Notes (Signed)
Procedures PA catheter:  Routine monitors. Timeout, sterile prep, drape, FBP R neck.  Trendelenburg position.  1% Lido local, finder and trocar RIJ 1st pass with US guidance.  Cordis placed over J wire. PA catheter in easily.  Sterile dressing applied.  Patient tolerated well, VSS.  Sandford Craze, MD

## 2012-10-19 NOTE — Progress Notes (Signed)
Turned patient vent to SIMV 4, 40%, at 19:15. Patient is tolerating settings well at this time.

## 2012-10-19 NOTE — Op Note (Signed)
CARDIOTHORACIC SURGERY OPERATIVE NOTE  Date of Procedure:  10/19/2012  Preoperative Diagnosis: Aortic Valve Mass   Postoperative Diagnosis: Same   Procedure:    Resection of Aortic Valve Mass  Aortic Valve Repair   Surgeon: Salvatore Decent. 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 Pain persisted prompting him to present to the emergency department at [facility] where EKG revealed sinus rhythm without acute ST changes. Cardiac enzymes were abnormal prompting transfer to [facility] for further therapy. The patient ruled in for an acute non-ST segment elevation myocardial infarction. Transthoracic echocardiogram was performed and notable for the presence of a mobile mass adherent to the aortic valve. Transesophageal echocardiogram was performed confirming the presence of a pedunculated mass adherent to the aortic valve at the commissure between the left and non-coronary sinus of Valsalva. The mass is quite mobile and pedunculated. Blood cultures were obtained and remain negative the patient has no other clinical stigmata to suggest ongoing bacterial endocarditis. Cardiac gated CT scan was performed earlier today. This confirms the presence of the mass adherent to the aortic valve is also notable for the absence of any  significant proximal vessel coronary artery disease.  The patient has been seen in consultation and counseled at length regarding the indications, risks and potential benefits of surgery.  All questions have been answered, and the patient provides full informed consent for the operation as described. .    DETAILS OF THE OPERATIVE PROCEDURE  The patient is brought to the operating room on the above mentioned date and central monitoring was established by the anesthesia team including placement of Swan-Ganz catheter and radial arterial line. The patient is placed in the supine position on the operating table.  Intravenous antibiotics are administered. General endotracheal anesthesia is induced uneventfully. A Foley catheter is placed.  Baseline transesophageal echocardiogram was performed.  Findings were notable for a small mobile mass adherent to the aortic valve near the commissure between the left and non-coronary sinus of Valsalva.  There was no aortic insufficiency.  There was normal LV function.  The patient's chest, abdomen, both groins, and both lower extremities are prepared and draped in a sterile manner. A time out procedure is performed.  A median sternotomy incision was performed and the pericardium is opened. The ascending aorta is normal in appearance. The ascending aorta and the right atrium are cannulated for cardioplegia bypass.  Adequate heparinization is verified.   A retrograde cardioplegia cannula is placed through the right atrium into the coronary sinus.  The entire pre-bypass portion of the operation was notable for stable hemodynamics.  Cardiopulmonary bypass was begun and the surface of the heart is inspected.  A cardioplegia cannula is placed in the ascending aorta.  A temperature probe was placed in the interventricular septum.  The patient is cooled to 34C systemic temperature.  The aortic cross  clamp is applied and cold blood cardioplegia is delivered initially in an  antegrade fashion through the aortic root.   Supplemental cardioplegia is given retrograde through the coronary sinus catheter.  Iced saline slush is applied for topical hypothermia.  The initial cardioplegic arrest is rapid with early diastolic arrest.  Repeat doses of cardioplegia are administered intermittently throughout the entire cross clamp portion of the operation through the coronary sinus catheter in order to maintain completely flat electrocardiogram and septal myocardial temperature below 15C.  Myocardial protection was felt to be excellent.  An oblique transverse aortotomy incision was performed.  The aortic valve was inspected. There is an obvious mobile mass adherent to the non-coronary leaflet of the aortic valve very close to the commissure between the left and non-coronary sinus of Valsalva.  This mass is deep breath or pain in color and frond-like with gross appearance consistent with a papillary fibroelastoma. The mass is excised from the leaflet sharply including a very small rim of the adjacent normal leaflet and a tiny portion of the mass is sent for culture to rule out vegetation. The remainder sent to pathology.  The remainder of the valve was inspected. There are a few fenestrations and excrescences but no other abnormal masses. The valve still appears watertight and functions normally. The valve was repaired using a single pledgeted 4-0 Prolene suture placed in the commissure between the left and non-coronary sinus of Valsalva to obliterate the area of defect of the leaflet where the mass was resected.  The aortotomy was closed using a 2-layer closure of running 4-0 Prolene suture.  One final dose of warm retrograde "hot shot" cardioplegia was administered retrograde through the coronary sinus catheter while all air was evacuated through the aortic root.  The aortic cross clamp was removed after a total cross clamp time of 30 minutes.  Epicardial pacing wires are fixed to the right  ventricular outflow tract and to the right atrial appendage. The patient is rewarmed to 37C temperature. The aortic and left ventricular vents are removed.  The patient is weaned and disconnected from cardiopulmonary bypass.  The patient's rhythm at separation from bypass was normal sinus.  The patient was weaned from cardioplegic bypass without any inotropic support. Total cardiopulmonary bypass time for the operation was 46 minutes.  Followup transesophageal echocardiogram performed after separation from bypass revealed a normal functioning aortic valve with no aortic insufficiency.  Left ventricular function was unchanged from preoperatively.  The aortic and venous cannula were removed uneventfully. Protamine was administered to reverse the anticoagulation. The mediastinum and pleural space were inspected for hemostasis and irrigated with saline solution. The mediastinum was drained using 2 chest tubes placed through separate stab incisions inferiorly.  The soft tissues anterior to the aorta were reapproximated loosely. The sternum is closed with double strength sternal wire. The soft tissues anterior to the sternum were closed in multiple layers and the skin is closed with a running subcuticular skin closure.  The post-bypass portion of the operation was notable for stable rhythm and hemodynamics.  No blood products were administered during the operation.  The patient tolerated the procedure well and is transported to the surgical intensive care in stable condition. There are no intraoperative complications. All sponge instrument and needle counts are verified correct at completion of the operation.    Salvatore Decent. Cornelius Moras MD 10/19/2012 5:24 PM

## 2012-10-19 NOTE — Progress Notes (Signed)
Patient ID: Andrew Le, male   DOB: 1966-10-28, 46 y.o.   MRN: [id number]   SICU Evening Rounds:    Hemodynamically stable Waking up. Still not warmed up yet. Urine output good. CT output low.  A/P: stable. Wean to extubate when warm.

## 2012-10-19 NOTE — Preoperative (Signed)
Beta Blockers   Reason not to administer Beta Blockers:Not Applicable 

## 2012-10-19 NOTE — Brief Op Note (Addendum)
                   [address]            [name] [address]          [phone]    10/14/2012 - 10/19/2012  3:54 PM  PATIENT:  Andrew Le  46 y.o. 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

## 2012-10-19 NOTE — Transfer of Care (Signed)
Immediate Anesthesia Transfer of Care Note  Patient: Andrew Le  Procedure(s) Performed: Procedure(s) (LRB) with comments: INTRAOPERATIVE TRANSESOPHAGEAL ECHOCARDIOGRAM (N/A) AORTIC VALVE REPAIR (N/A) - with resection of aortic valve mass.   Patient Location: SICU  Anesthesia Type:General  Level of Consciousness: sedated and Patient remains intubated per anesthesia plan  Airway & Oxygen Therapy: Patient remains intubated per anesthesia plan and Patient placed on Ventilator (see vital sign flow sheet for setting)  Post-op Assessment: Report given to PACU RN and Post -op Vital signs reviewed and stable  Post vital signs: Reviewed and stable  Complications: No apparent anesthesia complications

## 2012-10-19 NOTE — OR Nursing (Signed)
Piece of pericardium not used by Dr. Cornelius Moras.

## 2012-10-19 NOTE — Progress Notes (Signed)
RT placed patient on cpap, ps, 10/5, 40% at 2125 patient is tolerating cpap well at this time.

## 2012-10-19 NOTE — Progress Notes (Signed)
Patient failed NIF only producing -10cmH20, VC was normal , and ABG results were normal. Patient feels weak. RT placed patient back on full support SIMV 700, 12, 5.0 and 50% per protocol. RT will access patient again later and follow protocol for wean.

## 2012-10-19 NOTE — OR Nursing (Signed)
Piece of pericardium removed by Dr. Cornelius Moras. Per Dr. Orvan July instruction, pericardium soaked in 0.625% glutaraldehyde (expiration date of 10/19/2013) for three minutes. Pericardium then rinsed in two different basins of normal saline (expiration date of 06/2015) for three minutes each.

## 2012-10-19 NOTE — Anesthesia Preprocedure Evaluation (Addendum)
Anesthesia Evaluation  Patient identified by MRN, date of birth, ID band Patient awake    Reviewed: Allergy & Precautions, H&P , NPO status , Patient's Chart, lab work & pertinent test results, reviewed documented beta blocker date and time   Airway Mallampati: II TM Distance: >3 FB Neck ROM: Full    Dental  (+) Edentulous Upper, Dental Advisory Given and Poor Dentition   Pulmonary Current Smoker,  breath sounds clear to auscultation  Pulmonary exam normal       Cardiovascular + Past MI + Valvular Problems/Murmurs (Echo: lobulates 1 x 0.8 cm mass at junction of L and non-coronary aortic valve cusps, normal LVF, EF 60-65%) Rhythm:Regular Rate:Normal     Neuro/Psych negative neurological ROS  negative psych ROS   GI/Hepatic negative GI ROS, Neg liver ROS,   Endo/Other  negative endocrine ROS  Renal/GU negative Renal ROS     Musculoskeletal negative musculoskeletal ROS (+)   Abdominal   Peds  Hematology negative hematology ROS (+)   Anesthesia Other Findings   Reproductive/Obstetrics                         Anesthesia Physical Anesthesia Plan  ASA: III  Anesthesia Plan:    Post-op Pain Management:    Induction: Intravenous  Airway Management Planned: Oral ETT  Additional Equipment: Arterial line, CVP, PA Cath and TEE  Intra-op Plan:   Post-operative Plan: Post-operative intubation/ventilation  Informed Consent: I have reviewed the patients History and Physical, chart, labs and discussed the procedure including the risks, benefits and alternatives for the proposed anesthesia with the patient or authorized representative who has indicated his/her understanding and acceptance.   Dental advisory given  Plan Discussed with: CRNA and Surgeon  Anesthesia Plan Comments: (Plan routine monitors, A line, PA cath, GETA with TEE and post op ventilation  )        Anesthesia Quick  Evaluation

## 2012-10-19 NOTE — Progress Notes (Signed)
Patient ID: Andrew Le, male   DOB: February 12, 1966, 46 y.o.   MRN: [id number]    SUBJECTIVE:  No chest pain.  No SOB.   PHYSICAL EXAM Filed Vitals:   10/19/12 0300 10/19/12 0400 10/19/12 0500 10/19/12 0619  BP: 105/67 101/63 97/56 110/71  Pulse: 71 73 73 82  Temp:  97.8 F (36.6 C)    TempSrc:  Oral    Resp:      Height:      Weight:   157 lb 10.1 oz (71.5 kg)   SpO2: 96% 94% 96% 98%   General:  No distress Lungs:  Clear Heart:  RRR, no murmur Abdomen:  Positive bowel sounds, no rebound no guarding Extremities:  No edema  LABS: Lab Results  Component Value Date   TROPONINI 7.30* 10/15/2012   Results for orders placed during the hospital encounter of 10/14/12 (from the past 24 hour(s))  TYPE AND SCREEN     Status: Normal   Collection Time   10/18/12 12:03 PM      Component Value Range   ABO/RH(D) O POS     Antibody Screen NEG     Sample Expiration 10/21/2012    ABO/RH     Status: Normal   Collection Time   10/18/12 12:03 PM      Component Value Range   ABO/RH(D) O POS    BLOOD GAS, ARTERIAL     Status: Abnormal   Collection Time   10/18/12  1:00 PM      Component Value Range   FIO2 0.21     Delivery systems ROOM AIR     pH, Arterial 7.384  7.350 - 7.450   pCO2 arterial 36.7  35.0 - 45.0 mmHg   pO2, Arterial 77.4 (*) 80.0 - 100.0 mmHg   Bicarbonate 21.4  20.0 - 24.0 mEq/L   TCO2 22.6  0 - 100 mmol/L   Acid-base deficit 2.8 (*) 0.0 - 2.0 mmol/L   O2 Saturation 95.9     Patient temperature 98.6     Collection site RIGHT RADIAL     Drawn by [phone]     Sample type ARTERIAL     Allens test (pass/fail) PASS  PASS  PROTIME-INR     Status: Normal   Collection Time   10/18/12  1:42 PM      Component Value Range   Prothrombin Time 12.3  11.6 - 15.2 seconds   INR 0.92  0.00 - 1.49  APTT     Status: Abnormal   Collection Time   10/18/12  1:42 PM      Component Value Range   aPTT 108 (*) 24 - 37 seconds  URINALYSIS, ROUTINE W REFLEX MICROSCOPIC     Status: Normal   Collection Time   10/18/12  4:30 PM      Component Value Range   Color, Urine YELLOW  YELLOW   APPearance CLEAR  CLEAR   Specific Gravity, Urine 1.012  1.005 - 1.030   pH 5.5  5.0 - 8.0   Glucose, UA NEGATIVE  NEGATIVE mg/dL   Hgb urine dipstick NEGATIVE  NEGATIVE   Bilirubin Urine NEGATIVE  NEGATIVE   Ketones, ur NEGATIVE  NEGATIVE mg/dL   Protein, ur NEGATIVE  NEGATIVE mg/dL   Urobilinogen, UA 0.2  0.0 - 1.0 mg/dL   Nitrite NEGATIVE  NEGATIVE   Leukocytes, UA NEGATIVE  NEGATIVE  COMPREHENSIVE METABOLIC PANEL     Status: Abnormal   Collection Time   10/18/12  5:19 PM  Component Value Range   Sodium 137  135 - 145 mEq/L   Potassium 4.3  3.5 - 5.1 mEq/L   Chloride 100  96 - 112 mEq/L   CO2 28  19 - 32 mEq/L   Glucose, Bld 96  70 - 99 mg/dL   BUN 13  6 - 23 mg/dL   Creatinine, Ser 1.61  0.50 - 1.35 mg/dL   Calcium 9.6  8.4 - 09.6 mg/dL   Total Protein 7.1  6.0 - 8.3 g/dL   Albumin 3.8  3.5 - 5.2 g/dL   AST 59 (*) 0 - 37 U/L   ALT 73 (*) 0 - 53 U/L   Alkaline Phosphatase 134 (*) 39 - 117 U/L   Total Bilirubin 0.3  0.3 - 1.2 mg/dL   GFR calc non Af Amer 79 (*) >90 mL/min   GFR calc Af Amer >90  >90 mL/min  CBC     Status: Abnormal   Collection Time   10/19/12  5:20 AM      Component Value Range   WBC 12.0 (*) 4.0 - 10.5 K/uL   RBC 5.19  4.22 - 5.81 MIL/uL   Hemoglobin 15.7  13.0 - 17.0 g/dL   HCT 04.5  40.9 - 81.1 %   MCV 87.5  78.0 - 100.0 fL   MCH 30.3  26.0 - 34.0 pg   MCHC 34.6  30.0 - 36.0 g/dL   RDW 91.4  78.2 - 95.6 %   Platelets 246  150 - 400 K/uL  BASIC METABOLIC PANEL     Status: Normal   Collection Time   10/19/12  5:20 AM      Component Value Range   Sodium 140  135 - 145 mEq/L   Potassium 4.8  3.5 - 5.1 mEq/L   Chloride 102  96 - 112 mEq/L   CO2 27  19 - 32 mEq/L   Glucose, Bld 99  70 - 99 mg/dL   BUN 12  6 - 23 mg/dL   Creatinine, Ser 2.13  0.50 - 1.35 mg/dL   Calcium 9.9  8.4 - 08.6 mg/dL   GFR calc non Af Amer >90  >90 mL/min   GFR calc Af  Amer >90  >90 mL/min    Intake/Output Summary (Last 24 hours) at 10/19/12 0723 Last data filed at 10/19/12 0500  Gross per 24 hour  Intake 2010.5 ml  Output   1775 ml  Net  235.5 ml    ASSESSMENT AND PLAN:  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  Cultures thus far negative and no other evidence of endocarditis.  Antiphospholipid antibodies pending though looks like lupus anticoagulant may be positive.  This workup needs to be completed.  Plan is for OR in the am.   NQWMI I suspect an embolic event.  No evidence of obstructive CAD on CT though distal vessels not well visualized.    HYPERTRIGLYCERIDEMIA Continue statin.  Dietary consult completed.  TOBACCO ABUSE Nicotine patch and Xanax started.      Marca Ancona 10/19/2012 7:23 AM

## 2012-10-20 ENCOUNTER — Inpatient Hospital Stay (HOSPITAL_COMMUNITY): Payer: MEDICAID

## 2012-10-20 ENCOUNTER — Encounter (HOSPITAL_COMMUNITY): Payer: Self-pay | Admitting: Thoracic Surgery (Cardiothoracic Vascular Surgery)

## 2012-10-20 LAB — CBC
HCT: 35.7 % — ABNORMAL LOW (ref 39.0–52.0)
Hemoglobin: 12.3 g/dL — ABNORMAL LOW (ref 13.0–17.0)
MCH: 29.9 pg (ref 26.0–34.0)
MCHC: 34.5 g/dL (ref 30.0–36.0)
MCV: 86.7 fL (ref 78.0–100.0)
Platelets: 168 10*3/uL (ref 150–400)
RBC: 4.12 MIL/uL — ABNORMAL LOW (ref 4.22–5.81)
RDW: 12.7 % (ref 11.5–15.5)
WBC: 26 10*3/uL — ABNORMAL HIGH (ref 4.0–10.5)

## 2012-10-20 LAB — GLUCOSE, CAPILLARY
Glucose-Capillary: 140 mg/dL — ABNORMAL HIGH (ref 70–99)
Glucose-Capillary: 138 mg/dL — ABNORMAL HIGH (ref 70–99)
Glucose-Capillary: 127 mg/dL — ABNORMAL HIGH (ref 70–99)
Glucose-Capillary: 135 mg/dL — ABNORMAL HIGH (ref 70–99)

## 2012-10-20 LAB — BASIC METABOLIC PANEL
BUN: 9 mg/dL (ref 6–23)
Chloride: 102 mEq/L (ref 96–112)
Creatinine, Ser: 0.79 mg/dL (ref 0.50–1.35)
GFR calc Af Amer: >90 mL/min (ref >90)
GFR calc non Af Amer: >90 mL/min (ref >90)

## 2012-10-20 LAB — MAGNESIUM: Magnesium: 2.2 mg/dL (ref 1.5–2.5)

## 2012-10-20 LAB — POCT I-STAT 3, ART BLOOD GAS (G3+)
Acid-base deficit: 5 mmol/L — ABNORMAL HIGH (ref 0.0–2.0)
Bicarbonate: 20.1 mEq/L (ref 20.0–24.0)
pH, Arterial: 7.326 — ABNORMAL LOW (ref 7.350–7.450)
pO2, Arterial: 87 mmHg (ref 80.0–100.0)

## 2012-10-20 MED ORDER — SODIUM CHLORIDE 0.9 % IJ SOLN: 3.0000 mL | INTRAMUSCULAR | Status: DC | PRN

## 2012-10-20 MED ORDER — SODIUM CHLORIDE 0.9 % IV SOLN: 250.0000 mL | INTRAVENOUS | Status: DC | PRN

## 2012-10-20 MED ORDER — POTASSIUM CHLORIDE CRYS ER 20 MEQ PO TBCR
20.0000 meq | EXTENDED_RELEASE_TABLET | Freq: Every day | ORAL | Status: AC
  Administered 2012-10-21 – 2012-10-23 (×3): 20 meq via ORAL
  Filled 2012-10-20 (×3): qty 1

## 2012-10-20 MED ORDER — SODIUM CHLORIDE 0.9 % IJ SOLN
3.0000 mL | Freq: Two times a day (BID) | INTRAMUSCULAR | Status: DC
  Administered 2012-10-20 – 2012-10-22 (×6): 3 mL via INTRAVENOUS

## 2012-10-20 MED ORDER — CHLORHEXIDINE GLUCONATE 0.12 % MT SOLN
15.0000 mL | Freq: Two times a day (BID) | OROMUCOSAL | Status: DC
  Administered 2012-10-20 – 2012-10-22 (×5): 15 mL via OROMUCOSAL
  Filled 2012-10-20 (×8): qty 15

## 2012-10-20 MED ORDER — NICOTINE 14 MG/24HR TD PT24
14.0000 mg | MEDICATED_PATCH | Freq: Every day | TRANSDERMAL | Status: DC
  Administered 2012-10-20 – 2012-10-23 (×4): 14 mg via TRANSDERMAL
  Filled 2012-10-20 (×4): qty 1

## 2012-10-20 MED ORDER — TRAMADOL HCL 50 MG PO TABS: 50.0000 mg | ORAL_TABLET | ORAL | Status: DC | PRN

## 2012-10-20 MED ORDER — MOVING RIGHT ALONG BOOK
Freq: Once | Status: AC
  Administered 2012-10-20: 12:00:00
  Filled 2012-10-20: qty 1

## 2012-10-20 MED ORDER — FUROSEMIDE 20 MG PO TABS
20.0000 mg | ORAL_TABLET | Freq: Every day | ORAL | Status: DC
  Filled 2012-10-20: qty 1

## 2012-10-20 MED ORDER — MIDAZOLAM HCL 2 MG/2ML IJ SOLN
2.0000 mg | Freq: Once | INTRAMUSCULAR | Status: AC
  Administered 2012-10-20: 2 mg via INTRAVENOUS
  Filled 2012-10-20: qty 2

## 2012-10-20 MED ORDER — KETOROLAC TROMETHAMINE 15 MG/ML IJ SOLN
15.0000 mg | Freq: Four times a day (QID) | INTRAMUSCULAR | Status: AC
  Administered 2012-10-20 – 2012-10-21 (×4): 15 mg via INTRAVENOUS
  Filled 2012-10-20: qty 16
  Filled 2012-10-20 (×4): qty 1

## 2012-10-20 NOTE — Progress Notes (Signed)
RT extubated patient per protocol with a NIF of -20 and a VC of . Patient tolerated extubation well not stridor noted. RT will continue to monitor patient. RT placed patient on 4lpm [place]. HR 81, SAT 98%, BP 101/58, RR 24.

## 2012-10-20 NOTE — Anesthesia Postprocedure Evaluation (Signed)
  Anesthesia Post-op Note  Patient: Andrew Le  Procedure(s) Performed: Procedure(s) (LRB) with comments: INTRAOPERATIVE TRANSESOPHAGEAL ECHOCARDIOGRAM (N/A) AORTIC VALVE REPAIR (N/A) - with resection of aortic valve mass.   Patient Location: SICU  Anesthesia Type:General  Level of Consciousness: awake, alert  and oriented  Airway and Oxygen Therapy: Patient Spontanous Breathing and Patient connected to nasal cannula oxygen  Post-op Pain: none  Post-op Assessment: Post-op Vital signs reviewed, Patient's Cardiovascular Status Stable, Respiratory Function Stable, Patent Airway, No signs of Nausea or vomiting, Adequate PO intake and Pain level controlled  Post-op Vital Signs: Reviewed and stable  Complications: No apparent anesthesia complications

## 2012-10-20 NOTE — Progress Notes (Signed)
   CARDIOTHORACIC SURGERY PROGRESS NOTE   R1 Day Post-Op Procedure(s) (LRB): INTRAOPERATIVE TRANSESOPHAGEAL ECHOCARDIOGRAM (N/A) AORTIC VALVE REPAIR (N/A)  Subjective: Looks good.  Mild soreness in chest.   No nausea.  No SOB  Objective: Vital signs: BP Readings from Last 1 Encounters:  10/20/12 133/71   Pulse Readings from Last 1 Encounters:  10/20/12 97   Resp Readings from Last 1 Encounters:  10/20/12 23   Temp Readings from Last 1 Encounters:  10/20/12 99.3 F (37.4 C) Core (Comment)    Hemodynamics: PAP: (20-45)/(9-24) 35/19 mmHg CO:  [3.9 L/min-7.1 L/min] 6.3 L/min CI:  [2.1 L/min/m2-3.8 L/min/m2] 3.4 L/min/m2  Physical Exam:  Rhythm:   sinus  Breath sounds: clear  Heart sounds:  RRR w/out murmur  Incisions:  Dressings dry, intact  Abdomen:  Soft, non tender  Extremities:  Warm, well perfused   Intake/Output from previous day: 12/05 0701 - 12/06 0700 In: 5473.6 [P.O.:120; I.V.:3723.6; Blood:530; IV Piggyback:1100] Out: 3725 [Urine:2390; Blood:1095; Chest Tube:240] Intake/Output this shift: Total I/O In: 60 [I.V.:60] Out: 200 [Urine:150; Chest Tube:50]  Lab Results:  Basename 10/20/12 0410 10/19/12 2300  WBC 26.0* 26.2*  HGB 12.3* 12.7*  HCT 35.7* 35.3*  PLT 168 181   BMET:  Basename 10/20/12 0410 10/19/12 2300 10/19/12 1708 10/19/12 0520  NA 137 -- 134* --  K 4.2 -- 4.4 --  CL 102 -- 103 --  CO2 22 -- -- 27  GLUCOSE 141* -- 109* --  BUN 9 -- 8 --  CREATININE 0.79 0.82 -- --  CALCIUM 8.3* -- -- 9.9    CBG (last 3)   Basename 10/20/12 0742 10/20/12 0421 10/19/12 2329  GLUCAP 127* 138* 147*   ABG    Component Value Date/Time   PHART 7.326* 10/20/2012 0201   HCO3 20.1 10/20/2012 0201   TCO2 21 10/20/2012 0201   ACIDBASEDEF 5.0* 10/20/2012 0201   O2SAT 96.0 10/20/2012 0201   CXR: Looks good w/ mild bibasilar atelectasis and venous congestion  Assessment/Plan: S/P Procedure(s) (LRB): INTRAOPERATIVE TRANSESOPHAGEAL ECHOCARDIOGRAM  (N/A) AORTIC VALVE REPAIR (N/A)  Doing well POD1 Expected post op acute blood loss anemia, very mild, stable Expected post op volume excess, mild Postop leukocytosis, likely reactive   Mobilize  D/C tubes and lines  Transfer step down   [name],[name] 10/20/2012 8:16 AM

## 2012-10-20 NOTE — Procedures (Signed)
Extubation Procedure Note  Patient Details:   Name: Andrew Le DOB: 1965-12-11 MRN: [id number]   Airway Documentation:     Evaluation  O2 sats: stable throughout Complications: No apparent complications Patient did tolerate procedure well. Bilateral Breath Sounds: Clear   Yes  RT extubated patient per protocol to 4lpm [place]. Patient tolerated procedure. No stridor noted.  Arelia Sneddon 10/20/2012, 12:18 AM

## 2012-10-20 NOTE — Progress Notes (Signed)
SUBJECTIVE:  Appropriate chest soreness.  No SOB   PHYSICAL EXAM Filed Vitals:   10/20/12 0530 10/20/12 0600 10/20/12 0630 10/20/12 0700  BP:  133/73  128/72  Pulse: 92 93 93 102  Temp: 99.1 F (37.3 C) 99.3 F (37.4 C) 99.3 F (37.4 C) 99.3 F (37.4 C)  TempSrc:      Resp: 21 22 21 27   Height:      Weight:      SpO2: 99% 98% 99% 99%   General:  No distress Lungs:  Clear Heart:  RRR, positive rub Abdomen:  Positive bowel sounds, no rebound no guarding Extremities:  No edema  LABS: Lab Results  Component Value Date   TROPONINI 7.30* 10/15/2012   Results for orders placed during the hospital encounter of 10/14/12 (from the past 24 hour(s))  POCT I-STAT 4, (NA,K, GLUC, HGB,HCT)     Status: Abnormal   Collection Time   10/19/12  2:17 PM      Component Value Range   Sodium 137  135 - 145 mEq/L   Potassium 4.1  3.5 - 5.1 mEq/L   Glucose, Bld 105 (*) 70 - 99 mg/dL   HCT 16.1 (*) 09.6 - 04.5 %   Hemoglobin 12.6 (*) 13.0 - 17.0 g/dL  POCT I-STAT 4, (NA,K, GLUC, HGB,HCT)     Status: Abnormal   Collection Time   10/19/12  2:59 PM      Component Value Range   Sodium 136  135 - 145 mEq/L   Potassium 4.1  3.5 - 5.1 mEq/L   Glucose, Bld 96  70 - 99 mg/dL   HCT 40.9 (*) 81.1 - 91.4 %   Hemoglobin 12.6 (*) 13.0 - 17.0 g/dL  POCT I-STAT 4, (NA,K, GLUC, HGB,HCT)     Status: Abnormal   Collection Time   10/19/12  3:12 PM      Component Value Range   Sodium 129 (*) 135 - 145 mEq/L   Potassium 3.8  3.5 - 5.1 mEq/L   Glucose, Bld 91  70 - 99 mg/dL   HCT 78.2 (*) 95.6 - 21.3 %   Hemoglobin 8.8 (*) 13.0 - 17.0 g/dL  POCT I-STAT 3, BLOOD GAS (G3+)     Status: Abnormal   Collection Time   10/19/12  3:17 PM      Component Value Range   pH, Arterial 7.357  7.350 - 7.450   pCO2 arterial 42.7  35.0 - 45.0 mmHg   pO2, Arterial 216.0 (*) 80.0 - 100.0 mmHg   Bicarbonate 24.0  20.0 - 24.0 mEq/L   TCO2 25  0 - 100 mmol/L   O2 Saturation 100.0     Acid-base deficit 2.0  0.0 - 2.0  mmol/L   Sample type ARTERIAL    TISSUE CULTURE     Status: Normal (Preliminary result)   Collection Time   10/19/12  3:26 PM      Component Value Range   Specimen Description TISSUE     Special Requests AORTIC VALVE MASS     Gram Stain       Value: MODERATE WBC PRESENT,BOTH PMN AND MONONUCLEAR     NO ORGANISMS SEEN     Gram Stain Report Called to,Read Back By and Verified With: Gram Stain Report Called to,Read Back By and Verified With:  Dante Gang RN @16 :20 ON 10/19/12 BY WILSON Performed at [facility]   Culture NO GROWTH 1 DAY     Report Status PENDING  ANAEROBIC CULTURE     Status: Normal (Preliminary result)   Collection Time   10/19/12  3:26 PM      Component Value Range   Specimen Description TISSUE     Special Requests AORTIC VALVE MASS     Gram Stain PENDING     Culture PENDING     Report Status PENDING    GRAM STAIN     Status: Normal   Collection Time   10/19/12  3:26 PM      Component Value Range   Specimen Description TISSUE     Special Requests AORTIC VALVE MASS     Gram Stain       Value: MODERATE WBC PRESENT,BOTH PMN AND MONONUCLEAR     NO ORGANISMS SEEN     Gram Stain Report Called to,Read Back By and Verified With: Alyssa Hegarty RN 16:20 10/19/12 ([email or web address])   Report Status 10/19/2012 FINAL    PLATELET COUNT     Status: Normal   Collection Time   10/19/12  3:30 PM      Component Value Range   Platelets 162  150 - 400 K/uL  HEMOGLOBIN AND HEMATOCRIT, BLOOD     Status: Abnormal   Collection Time   10/19/12  3:30 PM      Component Value Range   Hemoglobin 9.7 (*) 13.0 - 17.0 g/dL   HCT 40.9 (*) 81.1 - 91.4 %  POCT I-STAT 4, (NA,K, GLUC, HGB,HCT)     Status: Abnormal   Collection Time   10/19/12  3:32 PM      Component Value Range   Sodium 132 (*) 135 - 145 mEq/L   Potassium 5.1  3.5 - 5.1 mEq/L   Glucose, Bld 105 (*) 70 - 99 mg/dL   HCT 78.2 (*) 95.6 - 21.3 %   Hemoglobin 9.2 (*) 13.0 - 17.0 g/dL  POCT I-STAT 4, (NA,K, GLUC, HGB,HCT)      Status: Abnormal   Collection Time   10/19/12  4:02 PM      Component Value Range   Sodium 133 (*) 135 - 145 mEq/L   Potassium 4.7  3.5 - 5.1 mEq/L   Glucose, Bld 114 (*) 70 - 99 mg/dL   HCT 08.6 (*) 57.8 - 46.9 %   Hemoglobin 10.2 (*) 13.0 - 17.0 g/dL  CBC     Status: Abnormal   Collection Time   10/19/12  5:00 PM      Component Value Range   WBC 19.6 (*) 4.0 - 10.5 K/uL   RBC 4.37  4.22 - 5.81 MIL/uL   Hemoglobin 13.1  13.0 - 17.0 g/dL   HCT 62.9 (*) 52.8 - 41.3 %   MCV 86.5  78.0 - 100.0 fL   MCH 30.0  26.0 - 34.0 pg   MCHC 34.7  30.0 - 36.0 g/dL   RDW 24.4  01.0 - 27.2 %   Platelets 157  150 - 400 K/uL  PROTIME-INR     Status: Abnormal   Collection Time   10/19/12  5:00 PM      Component Value Range   Prothrombin Time 15.6 (*) 11.6 - 15.2 seconds   INR 1.27  0.00 - 1.49  APTT     Status: Normal   Collection Time   10/19/12  5:00 PM      Component Value Range   aPTT 37  24 - 37 seconds  POCT I-STAT, CHEM 8     Status: Abnormal   Collection Time  10/19/12  5:08 PM      Component Value Range   Sodium 134 (*) 135 - 145 mEq/L   Potassium 4.4  3.5 - 5.1 mEq/L   Chloride 103  96 - 112 mEq/L   BUN 8  6 - 23 mg/dL   Creatinine, Ser 1.61  0.50 - 1.35 mg/dL   Glucose, Bld 096 (*) 70 - 99 mg/dL   Calcium, Ion 0.45  4.09 - 1.23 mmol/L   TCO2 21  0 - 100 mmol/L   Hemoglobin 12.2 (*) 13.0 - 17.0 g/dL   HCT 81.1 (*) 91.4 - 78.2 %  POCT I-STAT 3, BLOOD GAS (G3+)     Status: Abnormal   Collection Time   10/19/12  5:13 PM      Component Value Range   pH, Arterial 7.362  7.350 - 7.450   pCO2 arterial 39.4  35.0 - 45.0 mmHg   pO2, Arterial 65.0 (*) 80.0 - 100.0 mmHg   Bicarbonate 22.7  20.0 - 24.0 mEq/L   TCO2 24  0 - 100 mmol/L   O2 Saturation 93.0     Acid-base deficit 3.0 (*) 0.0 - 2.0 mmol/L   Patient temperature 35.7 C     Collection site ARTERIAL LINE     Drawn by Operator     Sample type ARTERIAL    GLUCOSE, CAPILLARY     Status: Abnormal   Collection Time   10/19/12   6:12 PM      Component Value Range   Glucose-Capillary 107 (*) 70 - 99 mg/dL  GLUCOSE, CAPILLARY     Status: Abnormal   Collection Time   10/19/12  7:15 PM      Component Value Range   Glucose-Capillary 111 (*) 70 - 99 mg/dL  GLUCOSE, CAPILLARY     Status: Abnormal   Collection Time   10/19/12  8:06 PM      Component Value Range   Glucose-Capillary 140 (*) 70 - 99 mg/dL  POCT I-STAT 3, BLOOD GAS (G3+)     Status: Abnormal   Collection Time   10/19/12  9:44 PM      Component Value Range   pH, Arterial 7.381  7.350 - 7.450   pCO2 arterial 36.3  35.0 - 45.0 mmHg   pO2, Arterial 122.0 (*) 80.0 - 100.0 mmHg   Bicarbonate 21.6  20.0 - 24.0 mEq/L   TCO2 23  0 - 100 mmol/L   O2 Saturation 99.0     Acid-base deficit 3.0 (*) 0.0 - 2.0 mmol/L   Patient temperature 36.7 C     Collection site ARTERIAL LINE     Drawn by Operator     Sample type ARTERIAL    GLUCOSE, CAPILLARY     Status: Abnormal   Collection Time   10/19/12  9:55 PM      Component Value Range   Glucose-Capillary 143 (*) 70 - 99 mg/dL  CBC     Status: Abnormal   Collection Time   10/19/12 11:00 PM      Component Value Range   WBC 26.2 (*) 4.0 - 10.5 K/uL   RBC 4.06 (*) 4.22 - 5.81 MIL/uL   Hemoglobin 12.7 (*) 13.0 - 17.0 g/dL   HCT 95.6 (*) 21.3 - 08.6 %   MCV 86.9  78.0 - 100.0 fL   MCH 31.3  26.0 - 34.0 pg   MCHC 36.0  30.0 - 36.0 g/dL   RDW 57.8  46.9 - 62.9 %   Platelets  181  150 - 400 K/uL  MAGNESIUM     Status: Abnormal   Collection Time   10/19/12 11:00 PM      Component Value Range   Magnesium 2.6 (*) 1.5 - 2.5 mg/dL  CREATININE, SERUM     Status: Normal   Collection Time   10/19/12 11:00 PM      Component Value Range   Creatinine, Ser 0.82  0.50 - 1.35 mg/dL   GFR calc non Af Amer >90  >90 mL/min   GFR calc Af Amer >90  >90 mL/min  GLUCOSE, CAPILLARY     Status: Abnormal   Collection Time   10/19/12 11:29 PM      Component Value Range   Glucose-Capillary 147 (*) 70 - 99 mg/dL   Comment 1 Documented in  Chart     Comment 2 Notify RN    POCT I-STAT 3, BLOOD GAS (G3+)     Status: Abnormal   Collection Time   10/19/12 11:41 PM      Component Value Range   pH, Arterial 7.373  7.350 - 7.450   pCO2 arterial 34.6 (*) 35.0 - 45.0 mmHg   pO2, Arterial 138.0 (*) 80.0 - 100.0 mmHg   Bicarbonate 20.2  20.0 - 24.0 mEq/L   TCO2 21  0 - 100 mmol/L   O2 Saturation 99.0     Acid-base deficit 4.0 (*) 0.0 - 2.0 mmol/L   Patient temperature 36.7 C     Collection site ARTERIAL LINE     Drawn by Operator     Sample type ARTERIAL    POCT I-STAT 3, BLOOD GAS (G3+)     Status: Abnormal   Collection Time   10/20/12  2:01 AM      Component Value Range   pH, Arterial 7.326 (*) 7.350 - 7.450   pCO2 arterial 38.5  35.0 - 45.0 mmHg   pO2, Arterial 87.0  80.0 - 100.0 mmHg   Bicarbonate 20.1  20.0 - 24.0 mEq/L   TCO2 21  0 - 100 mmol/L   O2 Saturation 96.0     Acid-base deficit 5.0 (*) 0.0 - 2.0 mmol/L   Patient temperature 36.9 C     Collection site ARTERIAL LINE     Drawn by Operator     Sample type ARTERIAL    CBC     Status: Abnormal   Collection Time   10/20/12  4:10 AM      Component Value Range   WBC 26.0 (*) 4.0 - 10.5 K/uL   RBC 4.12 (*) 4.22 - 5.81 MIL/uL   Hemoglobin 12.3 (*) 13.0 - 17.0 g/dL   HCT 45.4 (*) 09.8 - 11.9 %   MCV 86.7  78.0 - 100.0 fL   MCH 29.9  26.0 - 34.0 pg   MCHC 34.5  30.0 - 36.0 g/dL   RDW 14.7  82.9 - 56.2 %   Platelets 168  150 - 400 K/uL  BASIC METABOLIC PANEL     Status: Abnormal   Collection Time   10/20/12  4:10 AM      Component Value Range   Sodium 137  135 - 145 mEq/L   Potassium 4.2  3.5 - 5.1 mEq/L   Chloride 102  96 - 112 mEq/L   CO2 22  19 - 32 mEq/L   Glucose, Bld 141 (*) 70 - 99 mg/dL   BUN 9  6 - 23 mg/dL   Creatinine, Ser 1.30  0.50 - 1.35 mg/dL  Calcium 8.3 (*) 8.4 - 10.5 mg/dL   GFR calc non Af Amer >90  >90 mL/min   GFR calc Af Amer >90  >90 mL/min  MAGNESIUM     Status: Normal   Collection Time   10/20/12  4:10 AM      Component Value  Range   Magnesium 2.2  1.5 - 2.5 mg/dL  GLUCOSE, CAPILLARY     Status: Abnormal   Collection Time   10/20/12  4:21 AM      Component Value Range   Glucose-Capillary 138 (*) 70 - 99 mg/dL   Comment 1 Documented in Chart     Comment 2 Notify RN      Intake/Output Summary (Last 24 hours) at 10/20/12 0754 Last data filed at 10/20/12 0700  Gross per 24 hour  Intake 5473.58 ml  Output   3725 ml  Net 1748.58 ml    ASSESSMENT AND PLAN:  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  Cultures thus far negative and no other evidence of endocarditis.  Doing well post up. The anticardiolipin antibodies are all negative.  I will follow up the possibility of a false positive lupus anticoagulant by repeating this in the future off of anticoagulants.  (Possibly in another lab).   NQWMI I suspect an embolic event.  No evidence of obstructive CAD on CT though distal vessels not well visualized.         Fayrene Fearing [facility] 10/20/2012 7:54 AM

## 2012-10-20 NOTE — Progress Notes (Signed)
TCTS BRIEF SICU PROGRESS NOTE  1 Day Post-Op  S/P Procedure(s) (LRB): INTRAOPERATIVE TRANSESOPHAGEAL ECHOCARDIOGRAM (N/A) AORTIC VALVE REPAIR (N/A)   Doing very well NSR w/ stable BP Ambulating quickly around SICU Awaiting bed for transfer  Plan: Continue current plan.  I anticipate an accelerated recovery, potentially ready for d/c home Sunday or Monday  [name],[name] 10/20/2012 5:02 PM

## 2012-10-21 ENCOUNTER — Inpatient Hospital Stay (HOSPITAL_COMMUNITY): Payer: MEDICAID

## 2012-10-21 LAB — BASIC METABOLIC PANEL
BUN: 11 mg/dL (ref 6–23)
Calcium: 9.1 mg/dL (ref 8.4–10.5)
GFR calc Af Amer: >90 mL/min (ref >90)
GFR calc non Af Amer: >90 mL/min (ref >90)
Glucose, Bld: 132 mg/dL — ABNORMAL HIGH (ref 70–99)
Sodium: 137 mEq/L (ref 135–145)

## 2012-10-21 LAB — CBC
HCT: 34.6 % — ABNORMAL LOW (ref 39.0–52.0)
Hemoglobin: 11.8 g/dL — ABNORMAL LOW (ref 13.0–17.0)
MCH: 30.2 pg (ref 26.0–34.0)
MCHC: 34.1 g/dL (ref 30.0–36.0)
RDW: 12.9 % (ref 11.5–15.5)

## 2012-10-21 MED ORDER — FUROSEMIDE 10 MG/ML IJ SOLN
40.0000 mg | Freq: Once | INTRAMUSCULAR | Status: AC
  Administered 2012-10-21: 40 mg via INTRAVENOUS
  Filled 2012-10-21: qty 4

## 2012-10-21 NOTE — Progress Notes (Addendum)
                    [address]            [name] [address]          [phone]     2 Days Post-Op Procedure(s) (LRB): INTRAOPERATIVE TRANSESOPHAGEAL ECHOCARDIOGRAM (N/A) AORTIC VALVE REPAIR (N/A)  Subjective: Feels well, no complaints.  Objective: Vital signs in last 24 hours: Patient Vitals for the past 24 hrs:  BP Temp Temp src Pulse Resp SpO2 Weight  10/21/12 0455 101/73 mmHg 99.7 F (37.6 C) Oral 105  19  94 % 163 lb 4.8 oz (74.072 kg)  10/20/12 1716 138/75 mmHg 98.2 F (36.8 C) Oral - 20  97 % -  10/20/12 1652 112/65 mmHg - - 99  22  98 % -  10/20/12 1634 - 99.3 F (37.4 C) Oral - - - -  10/20/12 1600 114/65 mmHg - - 87  20  97 % -  10/20/12 1500 112/63 mmHg - - 85  19  98 % -  10/20/12 1400 103/66 mmHg - - 84  21  98 % -  10/20/12 1300 117/70 mmHg - - 79  17  100 % -  10/20/12 1200 117/72 mmHg - - 83  21  98 % -  10/20/12 1135 - 99 F (37.2 C) Oral - - - -  10/20/12 1100 126/80 mmHg - - 88  18  99 % -  10/20/12 1000 126/67 mmHg - - 98  24  98 % -  10/20/12 0900 119/69 mmHg 99.3 F (37.4 C) - 98  26  93 % -   Current Weight  10/21/12 163 lb 4.8 oz (74.072 kg)     Intake/Output from previous day: 12/06 0701 - 12/07 0700 In: 1240 [P.O.:1100; I.V.:140] Out: 2060 [Urine:1950; Chest Tube:110]    PHYSICAL EXAM:  Heart: RRR Lungs: decreased R base Wound: Clean and dry Extremities: No LE edema   Lab Results: CBC: Basename 10/21/12 0515 10/20/12 0410  WBC 18.0* 26.0*  HGB 11.8* 12.3*  HCT 34.6* 35.7*  PLT 150 168   BMET:  Basename 10/21/12 0515 10/20/12 0410  NA 137 137  K 4.5 4.2  CL 100 102  CO2 29 22  GLUCOSE 132* 141*  BUN 11 9  CREATININE 0.92 0.79  CALCIUM 9.1 8.3*    PT/INR:  Basename 10/19/12 1700  LABPROT 15.6*  INR 1.27    CXR: Findings: Swan-Ganz catheter and mediastinal drain have been  removed. The patient has small bilateral pleural effusions and  basilar atelectasis, greater on the right, which appear  slightly  increased. No pneumothorax identified. No pulmonary edema.  IMPRESSION:  Some increase in small bilateral pleural effusions and basilar  atelectasis, greater on the right.    CX's negative so far   Assessment/Plan: S/P Procedure(s) (LRB): INTRAOPERATIVE TRANSESOPHAGEAL ECHOCARDIOGRAM (N/A) AORTIC VALVE REPAIR (N/A)  CV- SR, BPs controlled.  Pleural effusion- Slightly increased on R and pt still on 2 L O2.  Will give Lasix IV today instead of po and watch.    Leukocytosis- WBC trending down, intraop cx's negative.  Continue to watch.  CRPI.  Home Sun/Mon if continues to progress.    LOS: 7 days    COLLINS,GINA H 10/21/2012   I have seen and examined Andrew Le and agree with the above assessment  and plan.  Delight Ovens MD Beeper [phone] Office [phone] 10/21/2012 10:43 AM

## 2012-10-21 NOTE — Progress Notes (Signed)
Cardiac Rehab Phase I  Order received and appreciated.  Chart reviewed.  Pt had just returned from ambulating independently with family in tow.  Pt stated that walk had gone well and they covered the whole unit without fatigue.  Reviewed pt d/c education.  Discussed ISP use, cough/splinting, restrictions, sternal precautions, diet, exercise, and smoking cessation.  Pt was very accepting of education and changes that would need to be made, but pt's wife did not seem as eager to make the dietary changes with him.  He will need continued support from outside sources to be successful.  Also reviewed Cardiac Rehab Phase II with request to have info sent to [facility] for rehab.  Pt and wife did voice understanding.  They were encouraged to continue to walk throughout the weekend. Fabio [place], MA, ACSM RCEP [phone]

## 2012-10-22 ENCOUNTER — Inpatient Hospital Stay (HOSPITAL_COMMUNITY): Payer: MEDICAID

## 2012-10-22 LAB — CBC
MCH: 30.4 pg (ref 26.0–34.0)
MCHC: 34.6 g/dL (ref 30.0–36.0)
Platelets: 183 10*3/uL (ref 150–400)
RDW: 12.7 % (ref 11.5–15.5)

## 2012-10-22 LAB — TISSUE CULTURE: Culture: NO GROWTH

## 2012-10-22 LAB — CULTURE, BLOOD (ROUTINE X 2)

## 2012-10-22 MED ORDER — POTASSIUM CHLORIDE CRYS ER 20 MEQ PO TBCR: 20.0000 meq | EXTENDED_RELEASE_TABLET | Freq: Every day | ORAL | Status: DC

## 2012-10-22 MED ORDER — LISINOPRIL 2.5 MG PO TABS
2.5000 mg | ORAL_TABLET | Freq: Every day | ORAL | Status: DC
  Administered 2012-10-22 – 2012-10-23 (×2): 2.5 mg via ORAL
  Filled 2012-10-22 (×2): qty 1

## 2012-10-22 MED ORDER — FUROSEMIDE 40 MG PO TABS: 40.0000 mg | ORAL_TABLET | Freq: Every day | ORAL | Status: DC

## 2012-10-22 MED ORDER — NICOTINE 14 MG/24HR TD PT24: 1.0000 | MEDICATED_PATCH | Freq: Every day | TRANSDERMAL | Status: DC

## 2012-10-22 MED ORDER — OXYCODONE HCL 5 MG PO TABS: 5.0000 mg | ORAL_TABLET | ORAL | Status: DC | PRN

## 2012-10-22 MED ORDER — ATORVASTATIN CALCIUM 40 MG PO TABS: 40.0000 mg | ORAL_TABLET | Freq: Every day | ORAL | Status: DC

## 2012-10-22 MED ORDER — LISINOPRIL 2.5 MG PO TABS: 2.5000 mg | ORAL_TABLET | Freq: Every day | ORAL | Status: DC

## 2012-10-22 MED ORDER — METOPROLOL TARTRATE 25 MG PO TABS
25.0000 mg | ORAL_TABLET | Freq: Two times a day (BID) | ORAL | Status: DC
  Administered 2012-10-22 – 2012-10-23 (×3): 25 mg via ORAL
  Filled 2012-10-22 (×5): qty 1

## 2012-10-22 MED ORDER — FUROSEMIDE 40 MG PO TABS
40.0000 mg | ORAL_TABLET | Freq: Every day | ORAL | Status: DC
  Administered 2012-10-22 – 2012-10-23 (×2): 40 mg via ORAL
  Filled 2012-10-22 (×2): qty 1

## 2012-10-22 MED ORDER — METOPROLOL TARTRATE 25 MG PO TABS: 25.0000 mg | ORAL_TABLET | Freq: Two times a day (BID) | ORAL | Status: DC

## 2012-10-22 MED ORDER — ASPIRIN 325 MG PO TBEC: 325.0000 mg | DELAYED_RELEASE_TABLET | Freq: Every day | ORAL | Status: AC

## 2012-10-22 NOTE — Progress Notes (Signed)
Patient EPW removed per order and protocol. All ends intact, patient tolerated well. Advised one hour of bedrest.

## 2012-10-22 NOTE — Progress Notes (Signed)
Patient ambulated 350 ft without 02, sats remain 95-99%. Patient tolerated well.

## 2012-10-22 NOTE — Discharge Summary (Signed)
I agree with the above discharge summary and plan for follow-up.  Andrew Le,Andrew Le  

## 2012-10-22 NOTE — Discharge Summary (Signed)
[address]            [place] [address]          [phone]         Discharge Summary  Name: Andrew Le DOB: March 09, 1966 46 y.o. MRN: [id number]   Admission Date: 10/14/2012 Discharge Date:     Admitting Diagnosis: Chest pain   Discharge Diagnosis:  Aortic valve mass Non-ST elevation myocardial infarction Right pleural effusion Postoperative leukocytosis Expected postoperative blood loss anemia  Past Medical History  Diagnosis Date  . Pleurisy   . Tobacco abuse 10/17/2012  . Acute myocardial infarction 10/15/2012  . 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 male with no significant past medical history other than long-standing history of tobacco use who was in his usual state of health until November 30 when he developed sudden onset of crushing substernal chest pain associated with shortness of breath and diaphoresis. Pain persisted, prompting him to present to the emergency department at [facility] where EKG revealed sinus rhythm without acute ST changes. Cardiac enzymes were abnormal, prompting transfer to [facility] for further therapy.    Hospital Course:  The patient was admitted to [facility] on 10/14/2012.  He ruled in for an acute non-ST segment elevation myocardial infarction. Transthoracic echocardiogram was performed and was notable for the presence of a mobile mass adherent to the aortic valve. Transesophageal echocardiogram was performed confirming the presence of a pedunculated, mobile mass adherent to the aortic valve at the commissure between the left and non-coronary sinus of Valsalva.  Blood cultures were negative, and the patient had no other clinical stigmata to suggest ongoing bacterial endocarditis. Cardiac gated CT scan was performed whichconfirmed the presence of the mass adherent to the  aortic valve, with no significant proximal vessel coronary artery disease. It was felt that his NSTEMI was related to an embolic event.  A thoracic surgical consultation was requested.  Dr. Cornelius Le saw the patient and reviewed his studies, and felt he would benefit from operative resection of the mass. All risks, benefits and alternatives of surgery were explained in detail, and the patient agreed to proceed.   The patient was taken to the operating room and underwent the above procedure.    The postoperative course has generally been uneventful.  He developed a leukocytosis without fever, which was thought to be reactive and was treated conservatively.  This has trended downward nicely.  He was mildly volume overloaded, and noted on chest x-ray to have a right pleural effusion.  He was treated with diuresis and this is improving.  He was started on a beta blocker, an aspirin, a statin, and an ACE-inhibitor for his NSTEMI.   He is overall doing well.  We anticipate discharge in the next 24 hours if he remains stable.     Recent vital signs:  Filed Vitals:   10/22/12 0426  BP: 124/77  Pulse: 109  Temp: 98.3 F (36.8 C)  Resp: 18    Recent laboratory studies:  CBC: Basename 10/22/12 0645 10/21/12 0515  WBC 16.7* 18.0*  HGB 11.2* 11.8*  HCT 32.4* 34.6*  PLT 183 150   BMET:  Basename 10/21/12 0515 10/20/12 0410  NA 137 137  K 4.5 4.2  CL  100 102  CO2 29 22  GLUCOSE 132* 141*  BUN 11 9  CREATININE 0.92 0.79  CALCIUM 9.1 8.3*    PT/INR:  Basename 10/19/12 1700  LABPROT 15.6*  INR 1.27       Discharge Medications:     Medication List     As of 10/22/2012 10:18 AM    TAKE these medications         aspirin 325 MG EC tablet   Take 1 tablet (325 mg total) by mouth daily.      atorvastatin 40 MG tablet   Commonly known as: LIPITOR   Take 1 tablet (40 mg total) by mouth at bedtime.      furosemide 40 MG tablet   Commonly known as: LASIX   Take 1 tablet (40 mg total) by  mouth daily. X 1 week      lisinopril 2.5 MG tablet   Commonly known as: PRINIVIL,ZESTRIL   Take 1 tablet (2.5 mg total) by mouth daily.      metoprolol tartrate 25 MG tablet   Commonly known as: LOPRESSOR   Take 1 tablet (25 mg total) by mouth 2 (two) times daily.      nicotine 14 mg/24hr patch   Commonly known as: NICODERM CQ - dosed in mg/24 hours   Place 1 patch onto the skin daily.      oxyCODONE 5 MG immediate release tablet   Commonly known as: Oxy IR/ROXICODONE   Take 1-2 tablets (5-10 mg total) by mouth every 3 (three) hours as needed for pain.      potassium chloride SA 20 MEQ tablet   Commonly known as: K-DUR,KLOR-CON   Take 1 tablet (20 mEq total) by mouth daily. X 1 week          Discharge Instructions:  The patient is to refrain from driving, heavy lifting or strenuous activity.  May shower daily and clean incisions with soap and water.  May resume regular diet.   Follow Up:      Discharge Orders    Future Orders Please Complete By Expires   Amb Referral to Cardiac Rehabilitation         Follow-up Information    Follow up with Andrew Rotunda, MD. Schedule an appointment as soon as possible for a visit in 2 weeks.   Contact information:   [address] [place] [address] [phone]       Follow up with Andrew Nails, MD. In 3 weeks. (Office will call to schedule appointment)    Contact information:   [address] [employer] [place] [address] [phone]           Andrew Le H 10/22/2012, 10:04 AM

## 2012-10-22 NOTE — Progress Notes (Addendum)
                    [address]            [name] [address]          [phone]     3 Days Post-Op Procedure(s) (LRB): INTRAOPERATIVE TRANSESOPHAGEAL ECHOCARDIOGRAM (N/A) AORTIC VALVE REPAIR (N/A)  Subjective: Still gets a little dyspneic with exertion.  Sore this am.  Objective: Vital signs in last 24 hours: Patient Vitals for the past 24 hrs:  BP Temp Temp src Pulse Resp SpO2 Weight  10/22/12 0429 - - - - - - 158 lb 12.8 oz (72.031 kg)  10/22/12 0426 124/77 mmHg 98.3 F (36.8 C) Oral 109  18  95 % -  10/21/12 2120 119/73 mmHg 99.5 F (37.5 C) Oral 105  18  93 % -  10/21/12 1855 - - - - - 95 % -  10/21/12 1700 - - - - - 99 % -  10/21/12 1439 121/72 mmHg 98.9 F (37.2 C) Oral 101  18  97 % -  10/21/12 1037 139/64 mmHg - - - - - -   Current Weight  10/22/12 158 lb 12.8 oz (72.031 kg)     Intake/Output from previous day: 12/07 0701 - 12/08 0700 In: 240 [P.O.:240] Out: 1350 [Urine:1350]    PHYSICAL EXAM:  Heart: RRR Lungs: Slightly diminished BS R base Wound: Clean and dry Extremities: No significant LE edema    Lab Results: CBC: Basename 10/22/12 0645 10/21/12 0515  WBC 16.7* 18.0*  HGB 11.2* 11.8*  HCT 32.4* 34.6*  PLT 183 150   BMET:  Basename 10/21/12 0515 10/20/12 0410  NA 137 137  K 4.5 4.2  CL 100 102  CO2 29 22  GLUCOSE 132* 141*  BUN 11 9  CREATININE 0.92 0.79  CALCIUM 9.1 8.3*    PT/INR:  Basename 10/19/12 1700  LABPROT 15.6*  INR 1.27   CXR: Findings: Two views of the chest were obtained. Epicardial pacer  wires are still present. There is no evidence for a pneumothorax.  There are bibasilar densities that may represent small effusions  and atelectasis. Heart and mediastinum are stable.  IMPRESSION:  Small bilateral pleural effusions and basilar atelectasis.    Assessment/Plan: S/P Procedure(s) (LRB): INTRAOPERATIVE TRANSESOPHAGEAL ECHOCARDIOGRAM (N/A) AORTIC VALVE REPAIR (N/A) CV- Slightly tachy,  100-110s.  BP stable.  Will increase Lopressor and add low dose ACE-I for NSTEMI. Rt pleural effusion- Stable CXR. Wean remaining O2 (on 1L). Leukocytosis- WBC trending down, intraop cx's negative.  Home ?later today vs in am.    LOS: 8 days    COLLINS,GINA H 10/22/2012   valve sounds crisp Wires out now  Poss d/c late today or in am Almost off 0o2 I have seen and examined Venetia Night and agree with the above assessment  and plan.  Delight Ovens MD Beeper [phone] Office [phone] 10/22/2012 11:49 AM

## 2012-10-23 MED FILL — Electrolyte-R (PH 7.4) Solution: INTRAVENOUS | Qty: 3000 | Status: AC

## 2012-10-23 MED FILL — Lidocaine HCl IV Inj 20 MG/ML: INTRAVENOUS | Qty: 5 | Status: AC

## 2012-10-23 MED FILL — Sodium Bicarbonate IV Soln 8.4%: INTRAVENOUS | Qty: 50 | Status: AC

## 2012-10-23 MED FILL — Magnesium Sulfate Inj 50%: INTRAMUSCULAR | Qty: 10 | Status: AC

## 2012-10-23 MED FILL — Sodium Chloride Irrigation Soln 0.9%: Qty: 3000 | Status: AC

## 2012-10-23 MED FILL — Heparin Sodium (Porcine) Inj 1000 Unit/ML: INTRAMUSCULAR | Qty: 30 | Status: AC

## 2012-10-23 MED FILL — Heparin Sodium (Porcine) Inj 1000 Unit/ML: INTRAMUSCULAR | Qty: 10 | Status: AC

## 2012-10-23 MED FILL — Potassium Chloride Inj 2 mEq/ML: INTRAVENOUS | Qty: 40 | Status: AC

## 2012-10-23 MED FILL — Mannitol IV Soln 20%: INTRAVENOUS | Qty: 500 | Status: AC

## 2012-10-23 MED FILL — Sodium Chloride IV Soln 0.9%: INTRAVENOUS | Qty: 1000 | Status: AC

## 2012-10-23 NOTE — Care Management Note (Signed)
    Page 1 of 1   10/23/2012     4:31:01 PM   CARE MANAGEMENT NOTE 10/23/2012  Patient:  Andrew Le, Andrew Le   Account Number:  [id number]  Date Initiated:  10/16/2012  Documentation initiated by:  Junius Creamer  Subjective/Objective Assessment:   adm w pos troponins     Action/Plan:   lives w wife   Anticipated DC Date:  10/23/2012   Anticipated DC Plan:  HOME/SELF CARE      DC Planning Services  CM consult      Choice offered to / List presented to:             Status of service:  Completed, signed off Medicare Important Message given?   (If response is "NO", the following Medicare IM given date fields will be blank) Date Medicare IM given:   Date Additional Medicare IM given:    Discharge Disposition:  HOME/SELF CARE  Per UR Regulation:  Reviewed for med. necessity/level of care/duration of stay  If discussed at Long Length of Stay Meetings, dates discussed:    Comments:  10/23/12 Xander Jutras,RN,BSN [phone] PT S/P AVR ON 12/5.  WIFE TO PROVIDE CARE AT HOME.  NO HOME NEEDS IDENTIFIED.  12/5 1650 Henrietta Mayo RN BSN MSN CCM X-ferred to SICU s/p AVR.  12/2 10:25a debbie dowell rn,bsn [phone]

## 2012-10-23 NOTE — Progress Notes (Addendum)
[address]            [place] [address]          [phone]     4 Days Post-Op  Procedure(s) (LRB): INTRAOPERATIVE TRANSESOPHAGEAL ECHOCARDIOGRAM (N/A) AORTIC VALVE REPAIR (N/A) Subjective: Looks and feels well, no C/O  Objective  Telemetry SR/ST   Temp:  [97.8 F (36.6 C)-98.9 F (37.2 C)] 97.8 F (36.6 C) (12/09 0535) Pulse Rate:  [74-114] 99  (12/09 0535) Resp:  [18] 18  (12/09 0535) BP: (103-129)/(61-80) 113/72 mmHg (12/09 0535) SpO2:  [95 %-96 %] 96 % (12/09 0535) Weight:  [158 lb 1.6 oz (71.714 kg)] 158 lb 1.6 oz (71.714 kg) (12/09 0535)   Intake/Output Summary (Last 24 hours) at 10/23/12 0835 Last data filed at 10/23/12 0535  Gross per 24 hour  Intake      0 ml  Output   2050 ml  Net  -2050 ml       General appearance: alert, cooperative and no distress Heart: regular rate and rhythm, no rub and no murmur Lungs: mildly dim in bases Abdomen: benign Extremities: no edema Wound: incisions heal;ing well  Lab Results:  Basename 10/21/12 0515  NA 137  K 4.5  CL 100  CO2 29  GLUCOSE 132*  BUN 11  CREATININE 0.92  CALCIUM 9.1  MG --  PHOS --   No results found for this basename: AST:2,ALT:2,ALKPHOS:2,BILITOT:2,PROT:2,ALBUMIN:2 in the last 72 hours No results found for this basename: LIPASE:2,AMYLASE:2 in the last 72 hours  Basename 10/22/12 0645 10/21/12 0515  WBC 16.7* 18.0*  NEUTROABS -- --  HGB 11.2* 11.8*  HCT 32.4* 34.6*  MCV 88.0 88.5  PLT 183 150   No results found for this basename: CKTOTAL:4,CKMB:4,TROPONINI:4 in the last 72 hours No components found with this basename: POCBNP:3 No results found for this basename: DDIMER in the last 72 hours No results found for this basename: HGBA1C in the last 72 hours No results found for this basename: CHOL,HDL,LDLCALC,TRIG,CHOLHDL in the last 72 hours No results found for this basename: TSH,T4TOTAL,FREET3,T3FREE,THYROIDAB in the last 72 hours No results  found for this basename: VITAMINB12,FOLATE,FERRITIN,TIBC,IRON,RETICCTPCT in the last 72 hours  Medications: Scheduled    . acetaminophen  1,000 mg Oral Q6H  . aspirin EC  325 mg Oral Daily  . atorvastatin  40 mg Oral q1800  . bisacodyl  10 mg Oral Daily   Or  . bisacodyl  10 mg Rectal Daily  . chlorhexidine  15 mL Mouth/Throat BID  . docusate sodium  200 mg Oral Daily  . furosemide  40 mg Oral Daily  . lisinopril  2.5 mg Oral Daily  . metoprolol tartrate  25 mg Oral BID  . nicotine  14 mg Transdermal Daily  . pantoprazole  40 mg Oral QAC breakfast  . potassium chloride  20 mEq Oral Daily  . sodium chloride  3 mL Intravenous Q12H  . [DISCONTINUED] metoprolol tartrate  12.5 mg Oral BID     Radiology/Studies:  Dg Chest 2 View  10/22/2012  *RADIOLOGY REPORT*  Clinical Data: Pleural effusion and status post aortic valve replacement.  CHEST - 2 VIEW  Comparison: 10/21/2012  Findings: Two views of the chest were obtained.  Epicardial pacer wires are still present.  There is no evidence for a pneumothorax. There are bibasilar densities that may represent small effusions and atelectasis.  Heart and mediastinum  are stable.  IMPRESSION: Small bilateral pleural effusions and basilar atelectasis.   Original Report Authenticated By: Richarda Overlie, M.D.     INR: Will add last result for INR, ABG once components are confirmed Will add last 4 CBG results once components are confirmed  Assessment/Plan: S/P Procedure(s) (LRB): INTRAOPERATIVE TRANSESOPHAGEAL ECHOCARDIOGRAM (N/A) AORTIC VALVE REPAIR (N/A) Plan for discharge: see discharge orders   LOS: 9 days    Andrew Le,Andrew Le 12/9/20138:35 AM    I have seen and examined the patient and agree with the assessment and plan as outlined.  Andrew Le H 10/23/2012 9:00 AM

## 2012-10-23 NOTE — Progress Notes (Signed)
Pt given d/c instructions at this time; pt verbalized understanding; pt awaiting for wife to arrive; pt to d/c home with wife today; IV and tele removed at this time; will cont. To monitor.

## 2012-10-24 LAB — ANTIPHOSPHOLIPID SYNDROME EVAL, BLD
Anticardiolipin IgM: 18 MPL U/mL — ABNORMAL HIGH (ref <11)
DRVVT: 43.9 secs — ABNORMAL HIGH (ref <42.9)
Lupus Anticoagulant: NOT DETECTED
PTT Lupus Anticoagulant: 70.9 secs — ABNORMAL HIGH (ref 28.0–43.0)
PTTLA 4:1 Mix: 66.1 secs — ABNORMAL HIGH (ref 28.0–43.0)

## 2012-10-24 LAB — ANAEROBIC CULTURE

## 2012-11-03 ENCOUNTER — Encounter: Payer: Self-pay | Admitting: Cardiology

## 2012-11-16 ENCOUNTER — Other Ambulatory Visit: Payer: Self-pay | Admitting: *Deleted

## 2012-11-16 DIAGNOSIS — I358 Other nonrheumatic aortic valve disorders: Secondary | ICD-10-CM

## 2012-11-17 DIAGNOSIS — I358 Other nonrheumatic aortic valve disorders: Secondary | ICD-10-CM

## 2012-11-17 HISTORY — DX: Other nonrheumatic aortic valve disorders: I35.8

## 2012-11-20 ENCOUNTER — Ambulatory Visit: Payer: Self-pay | Admitting: Thoracic Surgery (Cardiothoracic Vascular Surgery)

## 2012-11-30 ENCOUNTER — Encounter: Payer: Self-pay | Admitting: Cardiology

## 2012-12-08 ENCOUNTER — Ambulatory Visit (INDEPENDENT_AMBULATORY_CARE_PROVIDER_SITE_OTHER): Payer: Self-pay | Admitting: Cardiology

## 2012-12-08 ENCOUNTER — Encounter: Payer: Self-pay | Admitting: Cardiology

## 2012-12-08 ENCOUNTER — Encounter: Payer: Self-pay | Admitting: *Deleted

## 2012-12-08 VITALS — BP 122/78 | HR 88 | Ht 68.0 in | Wt 165.2 lb

## 2012-12-08 DIAGNOSIS — D4989 Neoplasm of unspecified behavior of other specified sites: Secondary | ICD-10-CM | POA: Insufficient documentation

## 2012-12-08 HISTORY — DX: Neoplasm of unspecified behavior of other specified sites: D49.89

## 2012-12-08 NOTE — Progress Notes (Signed)
   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  He initially presented with a non-Q-wave myocardial infarction. He was found to have a mass on his aortic valve. This was resected by Dr. Cornelius Moras.  Prior to this he had a CT which demonstrated no obstructive coronary disease. BMI was obviously related to the mass. He had normal left ventricular function. Post operative TEE demonstrated a stable well. Of note he did have an apparent suture that was through his skin but this has disappeared. He has had no fevers or chills. He has been very active. He denies any chest pressure, neck or arm discomfort. He's had no shortness of breath, PND or orthopnea. He is anxious to return to work.  Allergies  Allergen Reactions  . Penicillins     Nausea and headache    Current Outpatient Prescriptions  Medication Sig Dispense Refill  . aspirin EC 325 MG EC tablet Take 1 tablet (325 mg total) by mouth daily.  30 tablet      Past Medical History  Diagnosis Date  . Pleurisy   . Tobacco abuse 10/17/2012  . Acute myocardial infarction 10/15/2012  . S/P aortic valve repair 10/19/2012    Past Surgical History  Procedure Date  . Intraoperative transesophageal echocardiogram 10/19/2012    Procedure: INTRAOPERATIVE TRANSESOPHAGEAL ECHOCARDIOGRAM;  Surgeon: Purcell Nails, MD;  Location: [facility] OR;  Service: Open Heart Surgery;  Laterality: N/A;  . Aortic valve repair 10/19/2012    Procedure: AORTIC VALVE REPAIR;  Surgeon: Purcell Nails, MD;  Location: [facility] OR;  Service: Open Heart Surgery;  Laterality: N/A;  with resection of aortic valve mass.     ROS:  As stated in the HPI and negative for all other systems.  PHYSICAL EXAM BP 122/78  Pulse 88  Ht 5\' 8"  (1.727 m)  Wt 165 lb 3.2 oz (74.934 kg)  BMI 25.12 kg/m2 GENERAL:  Well appearing HEENT:  Pupils equal round and reactive, fundi not visualized, oral mucosa unremarkable NECK:  No jugular venous distention, waveform within normal limits,  carotid upstroke brisk and symmetric, no bruits, no thyromegaly LYMPHATICS:  No cervical, inguinal adenopathy LUNGS:  Clear to auscultation bilaterally BACK:  No CVA tenderness CHEST:  Well healed sternotomy scar. 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 I will followup echocardiogram in one year. However, recurrence of fibroblastoma has not been reported. Following this he would likely need no further therapy.  Of note the patient can return to work without restrictions.  Tobacco use For the most part he has quit smoking though he might smoke 1 once in a while. He understands the need for complete abstinence.  NQWMI This was related to the tumor. CT demonstrated no obstructive coronary disease.

## 2012-12-08 NOTE — Patient Instructions (Addendum)
The current medical regimen is effective;  continue present plan and medications.  Follow up in 1 year with Dr Hochrein.  You will receive a letter in the mail 2 months before you are due.  Please call us when you receive this letter to schedule your follow up appointment.  

## 2012-12-11 ENCOUNTER — Encounter: Payer: Self-pay | Admitting: Thoracic Surgery (Cardiothoracic Vascular Surgery)

## 2012-12-11 ENCOUNTER — Ambulatory Visit (INDEPENDENT_AMBULATORY_CARE_PROVIDER_SITE_OTHER): Payer: Self-pay | Admitting: Thoracic Surgery (Cardiothoracic Vascular Surgery)

## 2012-12-11 ENCOUNTER — Ambulatory Visit
Admission: RE | Admit: 2012-12-11 | Discharge: 2012-12-11 | Disposition: A | Discharge status: Home / Self Care | Payer: No Typology Code available for payment source | Source: Ambulatory Visit | Attending: Thoracic Surgery (Cardiothoracic Vascular Surgery) | Admitting: Thoracic Surgery (Cardiothoracic Vascular Surgery)

## 2012-12-11 VITALS — BP 123/81 | HR 107 | Resp 16 | Ht 68.0 in | Wt 165.0 lb

## 2012-12-11 DIAGNOSIS — Z9889 Other specified postprocedural states: Secondary | ICD-10-CM

## 2012-12-11 DIAGNOSIS — I358 Other nonrheumatic aortic valve disorders: Secondary | ICD-10-CM

## 2012-12-11 DIAGNOSIS — I359 Nonrheumatic aortic valve disorder, unspecified: Secondary | ICD-10-CM

## 2012-12-11 DIAGNOSIS — Z954 Presence of other heart-valve replacement: Secondary | ICD-10-CM

## 2012-12-11 DIAGNOSIS — Z09 Encounter for follow-up examination after completed treatment for conditions other than malignant neoplasm: Secondary | ICD-10-CM

## 2012-12-11 DIAGNOSIS — D4989 Neoplasm of unspecified behavior of other specified sites: Secondary | ICD-10-CM

## 2012-12-11 DIAGNOSIS — Z952 Presence of prosthetic heart valve: Secondary | ICD-10-CM

## 2012-12-11 DIAGNOSIS — I5189 Other ill-defined heart diseases: Secondary | ICD-10-CM

## 2012-12-11 DIAGNOSIS — R222 Localized swelling, mass and lump, trunk: Secondary | ICD-10-CM

## 2012-12-11 NOTE — Patient Instructions (Addendum)
The patient should continue to avoid any heavy lifting or strenuous use of arms or shoulders for at least a total of three months from the time of surgery. The patient may return to driving an automobile as long as they are no longer requiring oral narcotic pain relievers during the daytime.  It would be wise to start driving only short distances during the daylight and gradually increase from there as they feel comfortable. The patient should make every effort to continue to avoid smoking permanently.

## 2012-12-11 NOTE — Progress Notes (Signed)
                   [address]            [name] [address]          [phone]     CARDIOTHORACIC SURGERY OFFICE NOTE  Referring Provider is Rollene Rotunda, MD PCP is No primary provider on file.   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 Preoperatively he presented with an acute myocardial infarction due to embolization down the left main coronary artery.  His postoperative recovery has been entirely uncomplicated. He's been seen in followup by Dr. Antoine Poche and he returns to our office for routine followup today. The patient is doing exceptionally well. He states that he has no pain in his chest and he has not been taking any sort of pain relievers. His activity level is quite good. He wants to go back to work. He is continue to avoid all tobacco use.   Current Outpatient Prescriptions  Medication Sig Dispense Refill  . aspirin EC 325 MG EC tablet Take 1 tablet (325 mg total) by mouth daily.  30 tablet        Physical Exam:   BP 123/81  Pulse 107  Resp 16  Ht 5\' 8"  (1.727 m)  Wt 165 lb (74.844 kg)  BMI 25.09 kg/m2  SpO2 99%  General:  Well appearing  Chest:   Clear to auscultation  CV:   Regular rate and rhythm without murmur  Incisions:  Sternotomy is healing well, sternum is stable  Abdomen:  Soft and nontender  Extremities:  Warm and well-perfused  Diagnostic Tests:  *RADIOLOGY REPORT*   Clinical Data: Follow-up aortic valve repair.   CHEST - 2 VIEW   Comparison: 10/22/2012   Findings: Changes of median sternotomy.  Heart is normal size. Lungs are clear.  No effusions.  No acute bony abnormality.  No pneumothorax.   IMPRESSION: No acute cardiopulmonary disease.     Original Report Authenticated By: Charlett Nose, M.D.    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  Plan:  I've encouraged patient to continue to increase his physical activity as tolerated but to be careful not to do any sort of heavy lifting or strenuous use of his arms or shoulders for least another 6 weeks. I think he can return to work as long as he can avoid heavy lifting. I have reminded him to continue to avoid smoking cigarettes for the rest of his life. All of his questions been addressed. We will plan to see him back in one years time review results of his most recent followup echocardiogram.   Salvatore Decent. Cornelius Moras, MD 12/11/2012 1:03 PM

## 2013-01-04 ENCOUNTER — Telehealth: Payer: Self-pay | Admitting: Cardiology

## 2013-01-04 NOTE — Telephone Encounter (Signed)
New problem    Note to return to work was loss. Patient need another note fax back .     Fax # [phone].

## 2013-01-04 NOTE — Telephone Encounter (Signed)
Letter faxed as requested

## 2013-12-10 ENCOUNTER — Ambulatory Visit: Payer: Self-pay | Admitting: Thoracic Surgery (Cardiothoracic Vascular Surgery)

## 2013-12-12 ENCOUNTER — Telehealth: Payer: Self-pay | Admitting: *Deleted

## 2013-12-12 NOTE — Telephone Encounter (Signed)
NO SHOW FOR APPOINTMENT 12/10/13--LEFT VOICE MAIL ON 1/26 AND 1/28 FOR PATIENT TO CALL AND RESCHEDULE//CM

## 2014-11-27 IMAGING — CR DG CHEST 2V
2 series · 2 of 2 positions shown · non-contrast
Comparison: 10/21/2012

CLINICAL DATA: Pleural effusion and status post aortic valve
replacement.

CHEST - 2 VIEW

[w chest pa]
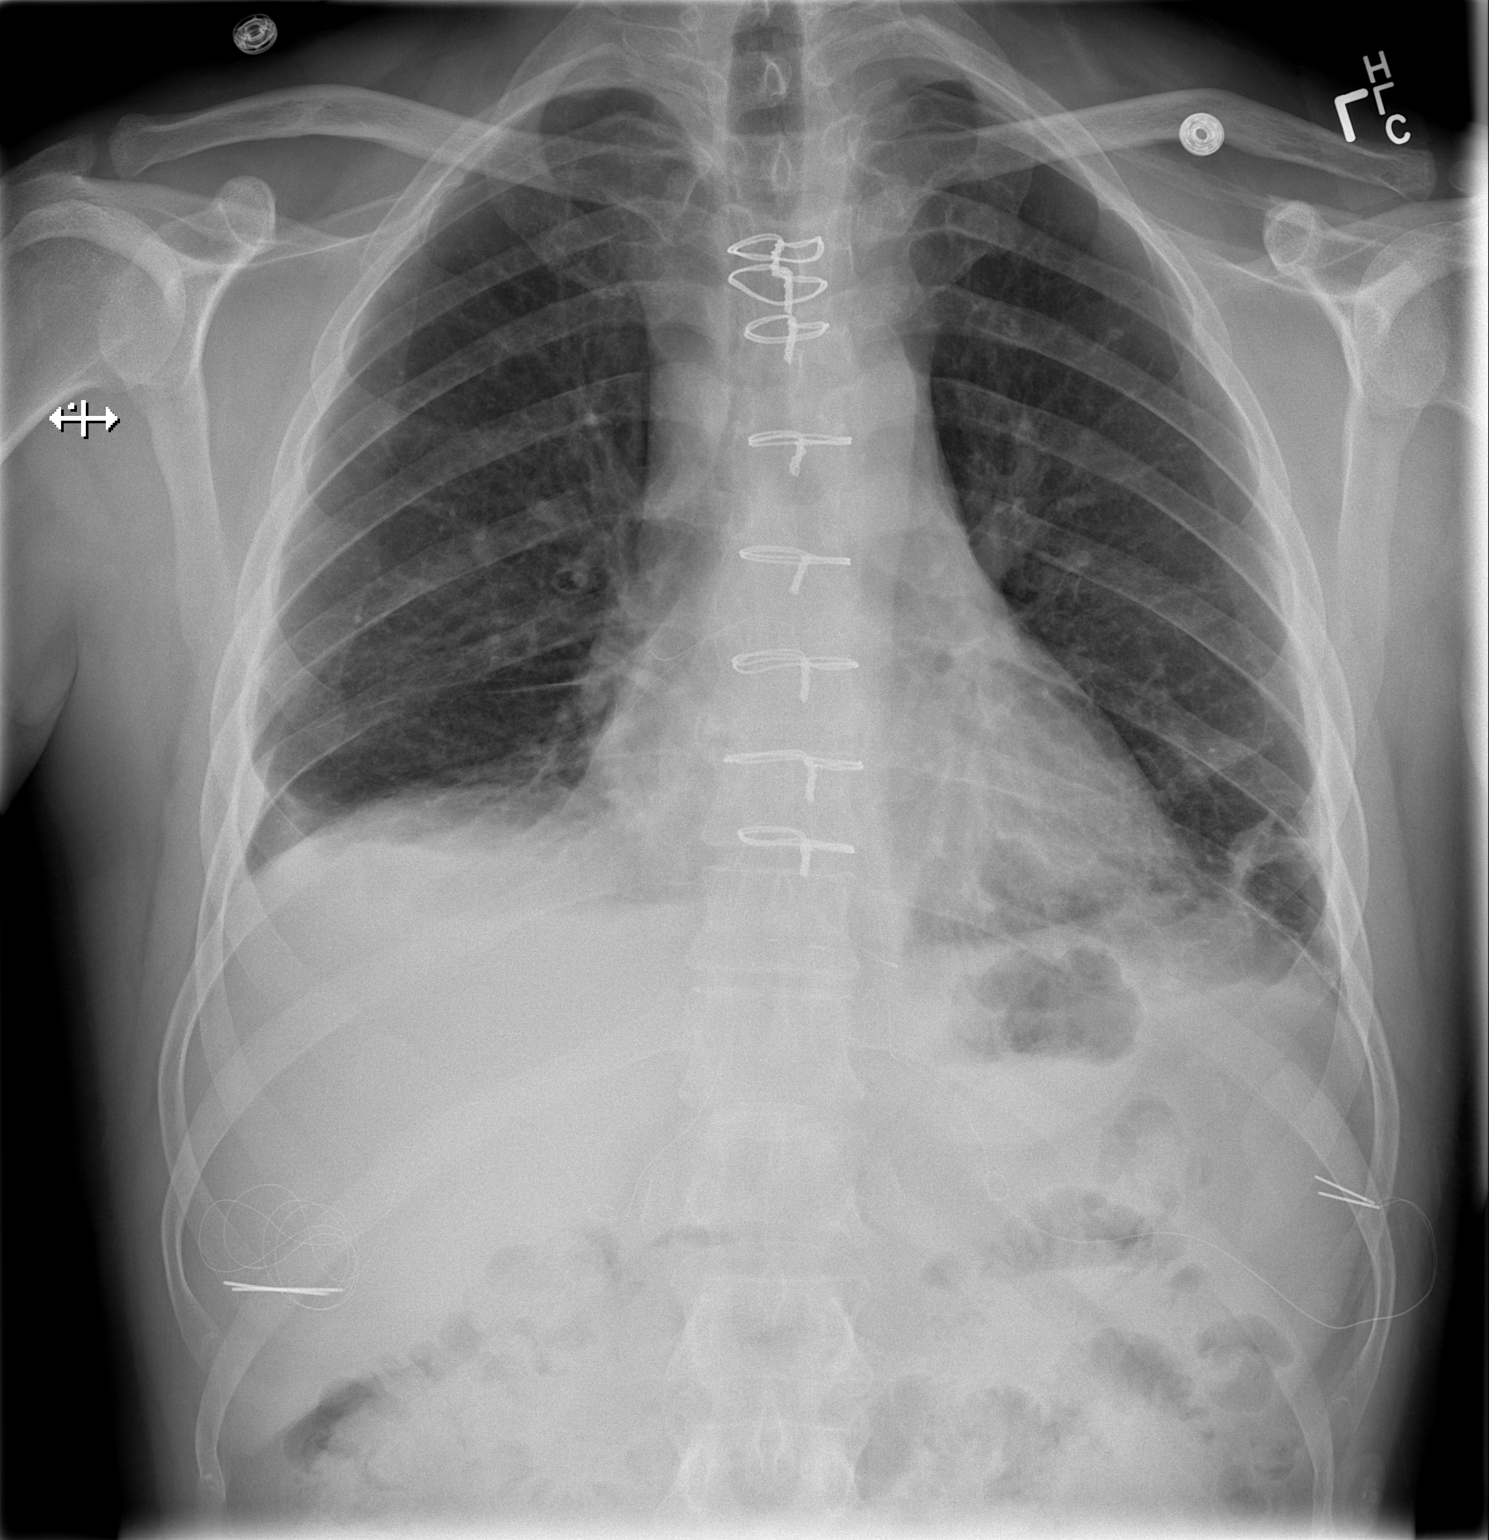

[w chest lat]
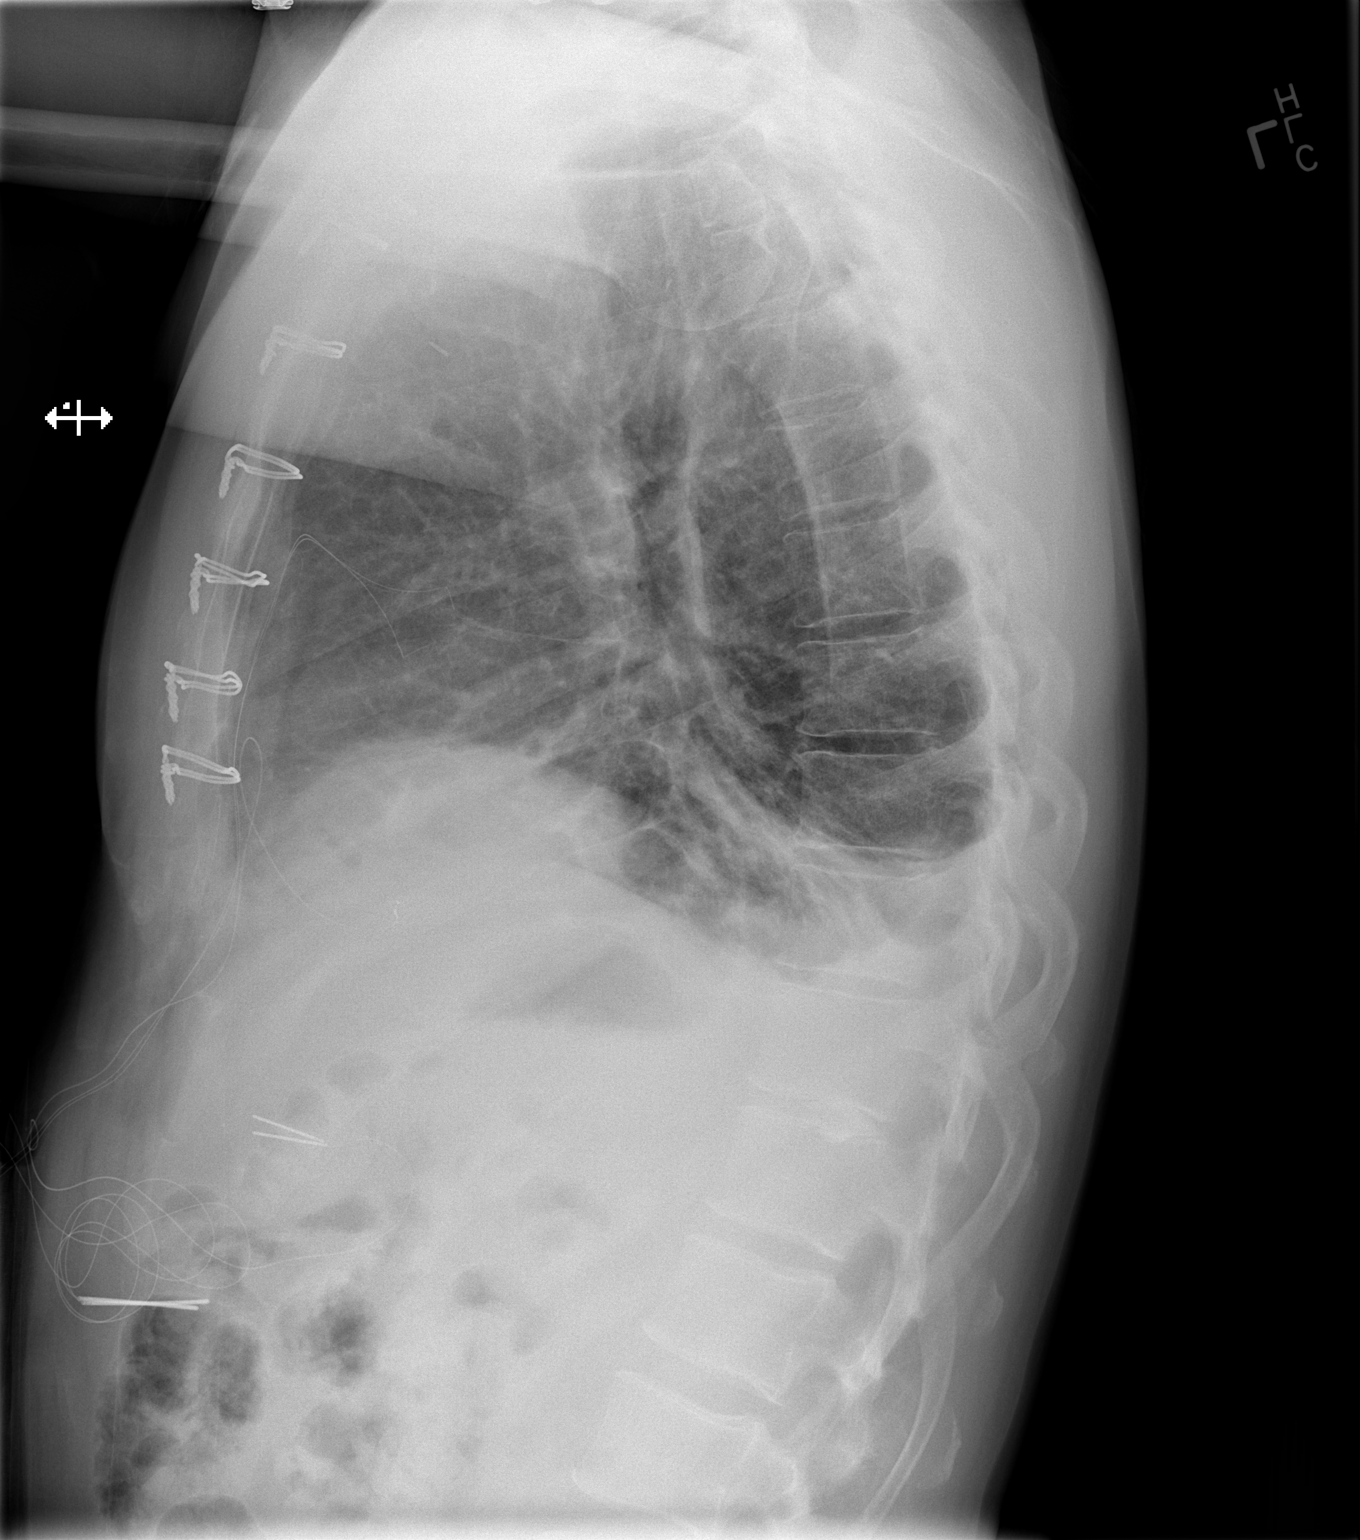

[2 of 2 positions shown; findings below may reference images not displayed]

FINDINGS: Two views of the chest were obtained.  Epicardial pacer
wires are still present.  There is no evidence for a pneumothorax.
There are bibasilar densities that may represent small effusions
and atelectasis.  Heart and mediastinum are stable.
IMPRESSION: Small bilateral pleural effusions and basilar atelectasis.
